# Patient Record
Sex: Female | Born: 1990 | Race: Black or African American | Hispanic: No | Marital: Single | Smoking: Never smoker
Health system: Southern US, Community
[De-identification: ages and names within clinical notes are randomized; demographics above are authoritative.]

## PROBLEM LIST (undated history)

## (undated) ENCOUNTER — Inpatient Hospital Stay (HOSPITAL_COMMUNITY): Payer: Self-pay

## (undated) DIAGNOSIS — J45909 Unspecified asthma, uncomplicated: Secondary | ICD-10-CM

## (undated) HISTORY — PX: WISDOM TOOTH EXTRACTION: SHX21

---

## 2015-03-22 ENCOUNTER — Ambulatory Visit (INDEPENDENT_AMBULATORY_CARE_PROVIDER_SITE_OTHER): Payer: Self-pay | Admitting: *Deleted

## 2015-03-22 ENCOUNTER — Encounter: Payer: Self-pay | Admitting: Obstetrics and Gynecology

## 2015-03-22 ENCOUNTER — Encounter: Payer: Self-pay | Admitting: *Deleted

## 2015-03-22 DIAGNOSIS — Z3201 Encounter for pregnancy test, result positive: Secondary | ICD-10-CM

## 2015-03-22 DIAGNOSIS — Z3490 Encounter for supervision of normal pregnancy, unspecified, unspecified trimester: Secondary | ICD-10-CM

## 2015-03-22 DIAGNOSIS — O219 Vomiting of pregnancy, unspecified: Secondary | ICD-10-CM

## 2015-03-22 LAB — POCT PREGNANCY, URINE: PREG TEST UR: POSITIVE — AB

## 2015-03-22 MED ORDER — PROMETHAZINE HCL 25 MG PO TABS: 25.0000 mg | ORAL_TABLET | Freq: Four times a day (QID) | ORAL | Status: DC | PRN

## 2015-03-22 NOTE — Progress Notes (Signed)
Here for pregnancy test, which was positive. States just moved here, would like to start prenatal care here. Given letter. Would like to wait on blood work .  States periods are regular, last was 02/09/15,. States has had some light spotting once or twice, no bleeding . Instructed her to come to [facility] if bleeding. C/o nausea , request medicine. Received order for phergan from Dr. Jolayne Pantheronstant.

## 2015-03-23 MED ORDER — PROMETHAZINE HCL 25 MG PO TABS: 25.0000 mg | ORAL_TABLET | Freq: Four times a day (QID) | ORAL | Status: DC | PRN

## 2015-03-23 NOTE — Addendum Note (Signed)
Addended by: Faythe CasaBELLAMY, JEANETTA M on: 03/23/2015 09:57 AM   Modules accepted: Orders

## 2015-05-04 ENCOUNTER — Encounter: Payer: Self-pay | Admitting: Family

## 2015-07-22 ENCOUNTER — Encounter: Payer: Self-pay | Admitting: Family Medicine

## 2015-07-22 ENCOUNTER — Ambulatory Visit (INDEPENDENT_AMBULATORY_CARE_PROVIDER_SITE_OTHER): Payer: Self-pay | Admitting: General Practice

## 2015-07-22 DIAGNOSIS — Z3201 Encounter for pregnancy test, result positive: Secondary | ICD-10-CM

## 2015-07-22 DIAGNOSIS — O219 Vomiting of pregnancy, unspecified: Secondary | ICD-10-CM

## 2015-07-22 LAB — POCT PREGNANCY, URINE: Preg Test, Ur: POSITIVE — AB

## 2015-07-22 MED ORDER — PROMETHAZINE HCL 25 MG PO TABS: 25.0000 mg | ORAL_TABLET | Freq: Four times a day (QID) | ORAL | Status: DC | PRN

## 2015-07-22 NOTE — Progress Notes (Signed)
Patient here today for upt. upt +. Patient states this is a different pregnancy from her positive test in December. First positive home test 07/16/15. LMP 06/18/15. EDD 03/24/16. [id number] today. Patient requests to start care here. Patient informed to make appt in 6-7 weeks and new OB packet given.

## 2015-07-26 ENCOUNTER — Telehealth: Payer: Self-pay | Admitting: General Practice

## 2015-07-26 NOTE — Telephone Encounter (Signed)
Patient called and left message stating she blacked out yesterday for a couple minutes and woke up shaky. Patient states her appt isn't until 09/07/15. Called patient and asked what her daily intake was like and if she has diabetes. Patient states she usually gets up in the morning and eats breakfast then doesn't eat again until later in the evening for dinner. Patient denies diabetes & states it happened while she was standing but her boyfriend caught her. Discussed importance of proper hydration with patient and importance of three meals a day with light snacks in between. Told patient it sounds like maybe it was related to that but to call back if this is reoccurring. Patient verbalized understanding to all & had no questions

## 2015-07-27 ENCOUNTER — Telehealth: Payer: Self-pay | Admitting: *Deleted

## 2015-07-27 NOTE — Telephone Encounter (Signed)
Kari Sandoval called and left a message this am stating she called yesterday about passing out and woke up shaking. States but then later her boyfriend told her he was with her and he thinks she had a seizure. Also c/o cramping and is concerned about seizure.   Called [name] and we discussed she has never had a seizure before. States she has not passed out again or had seizure activity. We discussed if that happens again to have someone bring her to [facility]U immediately for evaluation.   We also discussed her complaints of cramping- she denies severe pain or bleeding. We discussed a little cramping can be normal , but if she has bleeding or severe pain to come to mau for evalation. If she does not have any bleeding, severe pain or seizure activity come to appointment as scheduled and can discuss these complaints again with provider. She voices understanding.

## 2015-08-03 ENCOUNTER — Emergency Department (HOSPITAL_COMMUNITY)
Admission: EM | Admit: 2015-08-03 | Discharge: 2015-08-03 | Disposition: A | Discharge status: Home / Self Care | Payer: Medicaid Other | Attending: Emergency Medicine | Admitting: Emergency Medicine

## 2015-08-03 ENCOUNTER — Emergency Department (HOSPITAL_COMMUNITY): Payer: Medicaid Other

## 2015-08-03 ENCOUNTER — Encounter (HOSPITAL_COMMUNITY): Payer: Self-pay | Admitting: Family Medicine

## 2015-08-03 DIAGNOSIS — S3992XA Unspecified injury of lower back, initial encounter: Secondary | ICD-10-CM | POA: Insufficient documentation

## 2015-08-03 DIAGNOSIS — O4691 Antepartum hemorrhage, unspecified, first trimester: Secondary | ICD-10-CM | POA: Insufficient documentation

## 2015-08-03 DIAGNOSIS — Z3A01 Less than 8 weeks gestation of pregnancy: Secondary | ICD-10-CM | POA: Insufficient documentation

## 2015-08-03 DIAGNOSIS — O99512 Diseases of the respiratory system complicating pregnancy, second trimester: Secondary | ICD-10-CM | POA: Diagnosis not present

## 2015-08-03 DIAGNOSIS — O418X10 Other specified disorders of amniotic fluid and membranes, first trimester, not applicable or unspecified: Secondary | ICD-10-CM

## 2015-08-03 DIAGNOSIS — Y9389 Activity, other specified: Secondary | ICD-10-CM | POA: Diagnosis not present

## 2015-08-03 DIAGNOSIS — J45909 Unspecified asthma, uncomplicated: Secondary | ICD-10-CM | POA: Insufficient documentation

## 2015-08-03 DIAGNOSIS — S4991XA Unspecified injury of right shoulder and upper arm, initial encounter: Secondary | ICD-10-CM | POA: Insufficient documentation

## 2015-08-03 DIAGNOSIS — Y998 Other external cause status: Secondary | ICD-10-CM | POA: Diagnosis not present

## 2015-08-03 DIAGNOSIS — S3991XA Unspecified injury of abdomen, initial encounter: Secondary | ICD-10-CM | POA: Insufficient documentation

## 2015-08-03 DIAGNOSIS — O9A211 Injury, poisoning and certain other consequences of external causes complicating pregnancy, first trimester: Secondary | ICD-10-CM | POA: Diagnosis present

## 2015-08-03 DIAGNOSIS — O468X1 Other antepartum hemorrhage, first trimester: Secondary | ICD-10-CM

## 2015-08-03 DIAGNOSIS — Z79899 Other long term (current) drug therapy: Secondary | ICD-10-CM | POA: Diagnosis not present

## 2015-08-03 DIAGNOSIS — Y92410 Unspecified street and highway as the place of occurrence of the external cause: Secondary | ICD-10-CM | POA: Insufficient documentation

## 2015-08-03 DIAGNOSIS — R102 Other specified pregnancy related conditions, unspecified trimester: Secondary | ICD-10-CM

## 2015-08-03 HISTORY — DX: Unspecified asthma, uncomplicated: J45.909

## 2015-08-03 LAB — URINALYSIS, ROUTINE W REFLEX MICROSCOPIC
Bilirubin Urine: NEGATIVE
Glucose, UA: NEGATIVE mg/dL
HGB URINE DIPSTICK: NEGATIVE
Ketones, ur: NEGATIVE mg/dL
Nitrite: NEGATIVE
PROTEIN: NEGATIVE mg/dL
SPECIFIC GRAVITY, URINE: 1.005 (ref 1.005–1.030)
pH: 7 (ref 5.0–8.0)

## 2015-08-03 LAB — PREGNANCY, URINE: PREG TEST UR: POSITIVE — AB

## 2015-08-03 LAB — URINE MICROSCOPIC-ADD ON: RBC / HPF: NONE SEEN RBC/hpf (ref 0–5)

## 2015-08-03 MED ORDER — ACETAMINOPHEN 500 MG PO TABS
1000.0000 mg | ORAL_TABLET | Freq: Once | ORAL | Status: AC
  Administered 2015-08-03: 1000 mg via ORAL
  Filled 2015-08-03: qty 2

## 2015-08-03 MED ORDER — ACETAMINOPHEN 500 MG PO TABS: 500.0000 mg | ORAL_TABLET | Freq: Four times a day (QID) | ORAL | Status: DC | PRN
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
## 2015-08-04 LAB — URINE CULTURE

## 2015-08-27 ENCOUNTER — Telehealth: Payer: Self-pay | Admitting: Family Medicine

## 2015-08-27 NOTE — Telephone Encounter (Signed)
Patient called to cancel her New OB appointment. When I asked why she was canceling, she stated she had a miscarriage on 08/25/2015. I asked her when did they say she needed to come in for a F/U, she stated she had not gone to see anyone. I asked her if she would, [facility] so they could exam her.  She stated she was at work, and would come later.

## 2015-09-07 ENCOUNTER — Encounter: Payer: Self-pay | Admitting: Family

## 2015-11-23 ENCOUNTER — Encounter: Payer: Self-pay | Admitting: Nurse Practitioner

## 2015-12-28 DIAGNOSIS — H5711 Ocular pain, right eye: Secondary | ICD-10-CM | POA: Diagnosis not present

## 2015-12-28 DIAGNOSIS — H538 Other visual disturbances: Secondary | ICD-10-CM | POA: Diagnosis not present

## 2015-12-28 DIAGNOSIS — Z8669 Personal history of other diseases of the nervous system and sense organs: Secondary | ICD-10-CM | POA: Diagnosis not present

## 2015-12-28 DIAGNOSIS — H04129 Dry eye syndrome of unspecified lacrimal gland: Secondary | ICD-10-CM | POA: Diagnosis not present

## 2016-04-25 ENCOUNTER — Encounter: Payer: Self-pay | Admitting: Obstetrics and Gynecology

## 2016-05-11 DIAGNOSIS — Z3040 Encounter for surveillance of contraceptives, unspecified: Secondary | ICD-10-CM | POA: Diagnosis not present

## 2016-05-11 DIAGNOSIS — Z113 Encounter for screening for infections with a predominantly sexual mode of transmission: Secondary | ICD-10-CM | POA: Diagnosis not present

## 2016-05-11 DIAGNOSIS — Z6829 Body mass index (BMI) 29.0-29.9, adult: Secondary | ICD-10-CM | POA: Diagnosis not present

## 2016-05-11 DIAGNOSIS — Z124 Encounter for screening for malignant neoplasm of cervix: Secondary | ICD-10-CM | POA: Diagnosis not present

## 2016-05-11 DIAGNOSIS — R102 Pelvic and perineal pain: Secondary | ICD-10-CM | POA: Diagnosis not present

## 2016-05-11 DIAGNOSIS — Z01419 Encounter for gynecological examination (general) (routine) without abnormal findings: Secondary | ICD-10-CM | POA: Diagnosis not present

## 2016-07-13 ENCOUNTER — Ambulatory Visit (INDEPENDENT_AMBULATORY_CARE_PROVIDER_SITE_OTHER): Payer: 59 | Admitting: Internal Medicine

## 2016-07-13 VITALS — BP 104/70 | HR 80 | Temp 97.6°F | Wt 184.9 lb

## 2016-07-13 DIAGNOSIS — Z01818 Encounter for other preprocedural examination: Secondary | ICD-10-CM | POA: Insufficient documentation

## 2016-07-13 NOTE — Progress Notes (Signed)
26 y.o. year old female presents to establish care.  Moved 1.5 years ago from [place]. Was seen at [facility] two months ago for birth control prescription but otherwise has not established care at offices here in [place].   Acute Concerns: Patient wanting clearance for surgery. She intends to have a [place] butt lift and full body liposculpture performed next month by a physician in [place]. The surgery center is requesting certain labwork and imaging to be performed before patient can be cleared for procedures.   Diet: Reports diet is not very healthy. 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 Has tried to cut back so is only eating fast food about once a week now. Started cutting back on sodas one month ago so now mostly drinks water.   Exercise: No exercise.   Sexual/Birth History: Yes with boyfriend but he lives in [place]. [id number]. Recent SAB in 05/17.   Birth Control: Environmental education officer. Has been prescribed by [facility]. Using for past two months. Has whole year supply of refills.   Social:  Social History   Social History  . Marital status: Single    Spouse name: N/A  . Number of children: N/A  . Years of education: N/A   Social History Main Topics  . Smoking status: Never Smoker  . Smokeless tobacco: Not on file  . Alcohol use Yes     Comment: wine occ - "not since I found out I was pregnant"  . Drug use: No  . Sexual activity: Not on file   Other Topics Concern  . Not on file   Social History Narrative  . No narrative on file   Drinks alcohol at most once every two weeks. Smoked MJ in the past but not anymore.   No past surgical history on file. No Known Allergies  Immunization:  There is no immunization history on file for this patient.  Cancer Screening:  Pap Smear: Two months ago at [facility]. Was told no abnormalities.   Mammogram: N/A  Colonoscopy: N/A  Dexa:  N/A  Physical Exam: VITALS: Reviewed GEN: Pleasant female, NAD HEENT: Normocephalic, PERRL, EOMI, no scleral icterus, bilateral TM pearly grey, nasal septum midline, MMM, uvula midline, no anterior or posterior lymphadenopathy, no thyromegaly CARDIAC:RRR, S1 and S2 present, no murmur, no heaves/thrills RESP: CTAB, normal effort ABD: soft, no tenderness, normal bowel sounds EXT: No edema SKIN: no rash  ASSESSMENT & PLAN: 26 y.o. female presents for annual well woman/preventative exam and GYN exam. Please see problem specific assessment and plan.   Pre-op evaluation In preparation for elective cosmetic surgery (full body liposculpture and Brazilian butt lift) to be performed by physician in [place] next month. Patient unable to have labs and imaging performed today, as it is over a month from her procedure and labs and imaging must be performed within one month. Will return for labs.  - CBC, CMP, HIV, PT/INR, PTT, hCG to be drawn at next visit - CXR ordered to be performed at [facility] - EKG at next visit   Tarri Abernethy, MD, MPH PGY-2 [facility] Pager [phone]

## 2016-07-13 NOTE — Assessment & Plan Note (Signed)
In preparation for elective cosmetic surgery (full body liposculpture and Brazilian butt lift) to be performed by physician in [place] next month. Patient unable to have labs and imaging performed today, as it is over a month from her procedure and labs and imaging must be performed within one month. Will return for labs.  - CBC, CMP, HIV, PT/INR, PTT, hCG to be drawn at next visit - CXR ordered to be performed at [facility] - EKG at next visit

## 2016-07-13 NOTE — Patient Instructions (Signed)
It was nice meeting you today Ms. Kari Sandoval!  Please schedule another appointment to have your labs drawn and your EKG performed. You can have your CXR done at [facility] whenever it is convenient for you.   If you have any questions or concerns, please feel free to call the clinic.   Be well,  Dr. Natale Milch

## 2016-07-18 ENCOUNTER — Other Ambulatory Visit: Payer: 59

## 2016-07-21 ENCOUNTER — Other Ambulatory Visit: Payer: 59

## 2016-07-21 ENCOUNTER — Ambulatory Visit: Payer: 59 | Admitting: Internal Medicine

## 2016-08-02 ENCOUNTER — Other Ambulatory Visit: Payer: 59

## 2016-08-02 DIAGNOSIS — Z01818 Encounter for other preprocedural examination: Secondary | ICD-10-CM | POA: Diagnosis not present

## 2016-08-03 LAB — COMPREHENSIVE METABOLIC PANEL
ALBUMIN: 4.1 g/dL (ref 3.5–5.5)
ALT: 15 IU/L (ref 0–32)
AST: 14 IU/L (ref 0–40)
Albumin/Globulin Ratio: 1.4 (ref 1.2–2.2)
Alkaline Phosphatase: 67 IU/L (ref 39–117)
BUN/Creatinine Ratio: 10 (ref 9–23)
BUN: 7 mg/dL (ref 6–20)
Bilirubin Total: 0.3 mg/dL (ref 0.0–1.2)
CHLORIDE: 102 mmol/L (ref 96–106)
CO2: 22 mmol/L (ref 18–29)
CREATININE: 0.72 mg/dL (ref 0.57–1.00)
Calcium: 9.2 mg/dL (ref 8.7–10.2)
GFR calc non Af Amer: 115 mL/min/{1.73_m2} (ref >59)
GFR, EST AFRICAN AMERICAN: 133 mL/min/{1.73_m2} (ref >59)
GLOBULIN, TOTAL: 3 g/dL (ref 1.5–4.5)
Glucose: 107 mg/dL — ABNORMAL HIGH (ref 65–99)
POTASSIUM: 4.1 mmol/L (ref 3.5–5.2)
Sodium: 142 mmol/L (ref 134–144)
TOTAL PROTEIN: 7.1 g/dL (ref 6.0–8.5)

## 2016-08-03 LAB — CBC
HEMATOCRIT: 37.2 % (ref 34.0–46.6)
HEMOGLOBIN: 12.5 g/dL (ref 11.1–15.9)
MCH: 29.3 pg (ref 26.6–33.0)
MCHC: 33.6 g/dL (ref 31.5–35.7)
MCV: 87 fL (ref 79–97)
Platelets: 248 10*3/uL (ref 150–379)
RBC: 4.26 x10E6/uL (ref 3.77–5.28)
RDW: 12.9 % (ref 12.3–15.4)
WBC: 5.9 10*3/uL (ref 3.4–10.8)

## 2016-08-03 LAB — BETA HCG QUANT (REF LAB)

## 2016-08-03 LAB — APTT: aPTT: 25 s (ref 24–33)

## 2016-08-03 LAB — HIV ANTIBODY (ROUTINE TESTING W REFLEX): HIV Screen 4th Generation wRfx: NONREACTIVE

## 2016-08-03 LAB — PROTIME-INR
INR: 1 (ref 0.8–1.2)
PROTHROMBIN TIME: 10.4 s (ref 9.1–12.0)

## 2016-08-07 ENCOUNTER — Ambulatory Visit: Payer: 59 | Admitting: Internal Medicine

## 2016-08-08 ENCOUNTER — Telehealth: Payer: Self-pay | Admitting: Internal Medicine

## 2016-08-08 NOTE — Telephone Encounter (Signed)
Would like results.  She doesn't have access to [employer]. Could it be emailed to   [email or web address]?

## 2016-08-10 ENCOUNTER — Encounter: Payer: Self-pay | Admitting: Internal Medicine

## 2016-08-10 NOTE — Telephone Encounter (Signed)
Please call patient to let her know that we cannot email results to an unsecure email address. I have printed her results, and can either mail them to her or she can come pick them up. I have left them at the front desk for her to pick up, but if she would like them mailed, please find out which address to mail them to and I will do so. Thanks! - AJL

## 2016-08-11 NOTE — Telephone Encounter (Signed)
Pt has postponed her lipo surgery until dec.  She will be back in Nov to recheck her lab results. Her hemoglobin has to be above 13.  How can she increase that?

## 2016-08-14 NOTE — Telephone Encounter (Signed)
Called patient to discuss Hgb. Patient said she has switched surgeons and now Hgb of 12.5 is not an issue. She has rescheduled surgery to December and [facility].   Kari AbernethyAbigail J Lancaster, MD, MPH PGY-2 [facility] Pager [phone]

## 2016-09-18 ENCOUNTER — Telehealth: Payer: Self-pay | Admitting: Internal Medicine

## 2016-09-18 MED ORDER — LEVONORGESTREL 0.75 MG PO TABS: 0.7500 mg | ORAL_TABLET | Freq: Two times a day (BID) | ORAL | 0 refills | Status: DC

## 2016-09-18 NOTE — Telephone Encounter (Signed)
Pt would like emergency contraceptive or Plan B called into her pharmacy.  [facility] on [address].  Please let pt know if this can be done

## 2016-09-18 NOTE — Telephone Encounter (Signed)
Pt has called again for an answer about Plan B.  The encounter occurred Saturday night

## 2016-09-19 NOTE — Telephone Encounter (Signed)
Pt informed. Kari Sandoval, CMA  

## 2016-10-09 ENCOUNTER — Ambulatory Visit: Payer: 59 | Admitting: Family Medicine

## 2016-10-09 ENCOUNTER — Ambulatory Visit: Payer: 59 | Admitting: Obstetrics and Gynecology

## 2017-04-10 NOTE — L&D Delivery Note (Addendum)
OB/GYN Faculty Practice Delivery Note  Kari Sandoval is a 27 y.o. [id number] s/p SVD at [redacted]w[redacted]d. She was admitted for SOL.   ROM: 0h 16m with clear fluid GBS Status: negative Maximum Maternal Temperature: 98.3 F  Labor Progress: . AROM at 1043 . Epidural placed prior to delivery  Delivery Date/Time:1110 Delivery: Called to room and patient was complete and pushing. Head delivered LOA. Cord wrapped around anterior neck and under posterior axilla, reduced during delivery via summersault maneuver. Shoulder and body delivered in usual fashion. Infant with spontaneous cry, placed on mother's abdomen, dried and stimulated. Cord clamped x 2 after 1-minute delay, and cut by grandmother. Cord blood drawn. Placenta delivered spontaneously with gentle cord traction. Fundus firm with massage and Pitocin. Labia, perineum, vagina, and cervix inspected with small abrasion of the R vaginal wall noted, hemostatic and no repair necessitated.   Placenta: intact Complications: none Lacerations: none EBL:  Infant: vigorous female  APGARs 9 and 10  not yet weighed  Burman Nieves, MD Family Medicine Resident    I was gloved and present for entire period of pushing as well as delivery SVD without incident No difficulty with shoulders No lacerations Abrasion as listed above  Patient to postpartum, infant to remain with mother.   Clayton Bibles, CNM 02/10/18  12:37 PM

## 2017-04-16 ENCOUNTER — Encounter: Payer: Self-pay | Admitting: Internal Medicine

## 2017-04-16 ENCOUNTER — Ambulatory Visit (INDEPENDENT_AMBULATORY_CARE_PROVIDER_SITE_OTHER): Payer: Self-pay | Admitting: *Deleted

## 2017-04-16 ENCOUNTER — Ambulatory Visit (INDEPENDENT_AMBULATORY_CARE_PROVIDER_SITE_OTHER): Payer: Medicaid Other | Admitting: Internal Medicine

## 2017-04-16 ENCOUNTER — Other Ambulatory Visit: Payer: Self-pay

## 2017-04-16 DIAGNOSIS — N920 Excessive and frequent menstruation with regular cycle: Secondary | ICD-10-CM | POA: Insufficient documentation

## 2017-04-16 DIAGNOSIS — Z111 Encounter for screening for respiratory tuberculosis: Secondary | ICD-10-CM

## 2017-04-16 DIAGNOSIS — Z309 Encounter for contraceptive management, unspecified: Secondary | ICD-10-CM | POA: Diagnosis not present

## 2017-04-16 DIAGNOSIS — Z7689 Persons encountering health services in other specified circumstances: Secondary | ICD-10-CM

## 2017-04-16 NOTE — Progress Notes (Signed)
   Patient presents for PPD placement for work Denies previous positive TB test  Denies known exposure to TB   Tuberculin skin test applied to left ventral forearm.  Patient aware that she needs to return in 48-72 hours for PPD reading. L. Ducatte, RN, BSN    

## 2017-04-16 NOTE — Assessment & Plan Note (Signed)
Interfering with normal daily activities. Regular monthly period. Taking ibuprofen already, though not on a regular schedule.  - Begin ibuprofen about 3 days prior to onset of period, even if not experiencing symptoms. 200-400mg  q6-8 regularly for duration of period.  - Also discussed importance of hydration, especially during period

## 2017-04-16 NOTE — Patient Instructions (Addendum)
It was nice seeing you again today Kari Sandoval!  For your period cramps, begin taking 400 mg ibuprofen (two tablets) 3 days before your period is scheduled to start. Do not wait until you begin to feel symptoms of your period coming on. Take 400 mg ibuprofen every 6-8 hours regularly until the end of your period. It is also important to drink plenty of water to help keep your muscles from becoming dehydrated and worsening your cramps.   Please schedule an appointment at your earliest convenience to have a pap smear done to screen for cervical cancer. It is recommended to have this done every 3 years starting at age 29, and you have never had one.   We set two goals today: 1. Begin going to the gym one hour three times a week. Goal is 150 minutes of aerobic exercise weekly.  2. Decrease the amount of juice you are drinking to 4 ounces or less per day. You should primarily be drinking water (about eight 12 ounce glasses daily).   If you have any questions or concerns, please feel free to call the clinic.   Be well,  Dr. Natale MilchLancaster

## 2017-04-16 NOTE — Progress Notes (Signed)
27 y.o. year old female presents for well woman/preventative visit.  Acute Concerns: Menorrhagia Patient reporting significant pain 2/2 menstrual cramps monthly. Pain is so severe at times that she is unable to go about her normal daily activities. Takes Motrin which helps some. Periods usually last 4-6 days and come monthly. On heaviest day uses three "heavy" pads. Is not on any form of hormonal contraception.   Diet: Not well-balanced, but she is currently trying to change her diet. Typically only eats breakfast and dinner, but is trying to eat lunch more frequently. Also trying to cut down on amount of fast food she is eating. Drinks mostly juice daily, as well as hot chocolate. Rarely drinks water, though recently bought Crystal Light lemonade powder in an attempt to drink more water.  Of note, patient is planning to have liposuction performed in about two months.   Exercise: None currently. Plans to start a membership at [employer] in two weeks. Would like to begin exercising for 1 hour 7 days per week.    Sexual/Birth History: G2P0020. Currently sexually active.    Birth Control: Condoms  Surgical History: No past surgical history on file.  Allergies: No Known Allergies  Social:  Social History   Socioeconomic History  . Marital status: Single    Spouse name: Not on file  . Number of children: Not on file  . Years of education: Not on file  . Highest education level: Not on file  Social Needs  . Financial resource strain: Not on file  . Food insecurity - worry: Not on file  . Food insecurity - inability: Not on file  . Transportation needs - medical: Not on file  . Transportation needs - non-medical: Not on file  Occupational History  . Not on file  Tobacco Use  . Smoking status: Never Smoker  Substance and Sexual Activity  . Alcohol use: Yes    Comment: wine occ - "not since I found out I was pregnant"  . Drug use: No  . Sexual activity: Not on file  Other  Topics Concern  . Not on file  Social History Narrative  . Not on file   Drinks EtOH 1-2x/wk.   Immunization: Immunization History  Administered Date(s) Administered  . PPD Test 04/16/2017    Cancer Screening:  Pap Smear: None on file. Declining today.   Mammogram: N/A  Colonoscopy: N/A  Physical Exam: VITALS: Reviewed GEN: Pleasant female, NAD HEENT: Normocephalic, PERRL, EOMI, no scleral icterus, MMM, uvula midline CARDIAC:RRR, S1 and S2 present, no murmur, no heaves/thrills RESP: CTAB, normal effort ABD: Soft, no tenderness, normal bowel sounds SKIN: Warm and dry, no rash MSK: 5/5 strength upper and lower extremities bilaterally NEURO: A&Ox3. CN II-XII grossly intact.  PSYCH: Appropriate mood and affect  ASSESSMENT & PLAN: 27 y.o. female presents for annual well woman/preventative exam and GYN exam. Please see problem specific assessment and plan.   Encounter for weight management Discussed setting realistic goals. Also discussed recommended amount of aerobic activity weekly.   Goals set today: 1. Begin going to the gym one hour three times a week. Goal is 150 minutes of aerobic exercise weekly.  2. Decrease the amount of juice you are drinking to 4 ounces or less per day. You should primarily be drinking water (about eight 12 ounce glasses daily).   F/u at next visit.   Menorrhagia Interfering with normal daily activities. Regular monthly period. Taking ibuprofen already, though not on a regular schedule.  - Begin ibuprofen  about 3 days prior to onset of period, even if not experiencing symptoms. 200-400mg  q6-8 regularly for duration of period.  - Also discussed importance of hydration, especially during period   Tarri AbernethyAbigail J Jeanenne Licea, MD, MPH PGY-3 [facility] Pager [phone]

## 2017-04-16 NOTE — Assessment & Plan Note (Signed)
Discussed setting realistic goals. Also discussed recommended amount of aerobic activity weekly.   Goals set today: 1. Begin going to the gym one hour three times a week. Goal is 150 minutes of aerobic exercise weekly.  2. Decrease the amount of juice you are drinking to 4 ounces or less per day. You should primarily be drinking water (about eight 12 ounce glasses daily).   F/u at next visit.

## 2017-04-18 ENCOUNTER — Ambulatory Visit (INDEPENDENT_AMBULATORY_CARE_PROVIDER_SITE_OTHER): Payer: Self-pay | Admitting: *Deleted

## 2017-04-18 DIAGNOSIS — Z111 Encounter for screening for respiratory tuberculosis: Secondary | ICD-10-CM

## 2017-04-18 LAB — TB SKIN TEST
Induration: 2 mm
TB Skin Test: NEGATIVE

## 2017-04-18 NOTE — Progress Notes (Signed)
   PPD Reading Note PPD read and results entered in Epic Result: 2 mm induration. Interpretation: Negative  Given letter with results L. Leward Quanucatte, RN, BSN

## 2017-04-27 ENCOUNTER — Ambulatory Visit: Payer: Medicaid Other

## 2017-05-01 ENCOUNTER — Ambulatory Visit: Payer: Self-pay | Admitting: *Deleted

## 2017-05-01 ENCOUNTER — Ambulatory Visit: Payer: Self-pay

## 2017-05-01 DIAGNOSIS — Z23 Encounter for immunization: Secondary | ICD-10-CM

## 2017-05-01 NOTE — Progress Notes (Signed)
   Patient presents for MMR vaccine for work. Tolerated injection well. VIS statement given. Hubbard Hartshorn, RN, BSN

## 2017-06-15 ENCOUNTER — Encounter: Payer: Self-pay | Admitting: Internal Medicine

## 2017-06-15 ENCOUNTER — Other Ambulatory Visit: Payer: Self-pay

## 2017-06-15 ENCOUNTER — Other Ambulatory Visit: Payer: Medicaid Other

## 2017-06-15 ENCOUNTER — Ambulatory Visit (INDEPENDENT_AMBULATORY_CARE_PROVIDER_SITE_OTHER): Payer: Medicaid Other | Admitting: Internal Medicine

## 2017-06-15 VITALS — BP 108/68 | HR 78 | Temp 98.3°F | Ht 66.5 in | Wt 182.2 lb

## 2017-06-15 DIAGNOSIS — Z01818 Encounter for other preprocedural examination: Secondary | ICD-10-CM

## 2017-06-15 DIAGNOSIS — Z3201 Encounter for pregnancy test, result positive: Secondary | ICD-10-CM

## 2017-06-15 LAB — POCT UA - MICROSCOPIC ONLY

## 2017-06-15 LAB — POCT URINE PREGNANCY: PREG TEST UR: POSITIVE — AB

## 2017-06-15 LAB — POCT URINALYSIS DIP (MANUAL ENTRY)
BILIRUBIN UA: NEGATIVE
Blood, UA: NEGATIVE
Glucose, UA: NEGATIVE mg/dL
Ketones, POC UA: NEGATIVE mg/dL
NITRITE UA: NEGATIVE
PH UA: 6.5 (ref 5.0–8.0)
PROTEIN UA: NEGATIVE mg/dL
Spec Grav, UA: 1.025 (ref 1.010–1.025)
Urobilinogen, UA: 0.2 E.U./dL

## 2017-06-15 NOTE — Progress Notes (Signed)
   Subjective:   Patient: Kari Sandoval       Birthdate: March 02, 1991       MRN: [id number]      HPI  Kari PoliteLatoya Gilkison is a 27 y.o. female presenting for preop clearance.   Preop clearance Patient wishing to have a [facility] and is needing preop labs and imaging today.  Procedure is to be performed by Dr. Haywood [facility]oHasan at [facility] in [place], [place]. Presented for this same issue in April and had labs drawn at that time, however patient did not go through with the procedure then, and needs repeat labs. Procedure is to take place on 07/02/17 in [place]. EKG no longer required, only CXR and labs. Of note, patient wanting pregnancy test today, as she knows she had unprotected sex while she was ovulating. Has not missed period yet, but says it is due within the next two days.   Smoking status reviewed. Patient is never smoker.   Review of Systems See HPI.     Objective:  Physical Exam  Constitutional: She is oriented to person, place, and time and well-developed, well-nourished, and in no distress.  HENT:  Head: Normocephalic and atraumatic.  Nose: Nose normal.  Mouth/Throat: Oropharynx is clear and moist. No oropharyngeal exudate.  Eyes: Conjunctivae and EOM are normal. Pupils are equal, round, and reactive to light. Right eye exhibits no discharge. Left eye exhibits no discharge.  Neck: Normal range of motion. Neck supple.  Cardiovascular: Normal rate, regular rhythm and normal heart sounds.  No murmur heard. Pulmonary/Chest: Effort normal and breath sounds normal. No respiratory distress. She has no wheezes.  Abdominal: Soft. Bowel sounds are normal. She exhibits no distension. There is no tenderness.  Musculoskeletal:  5/5 strength upper and lower extremities bilaterally  Lymphadenopathy:    She has no cervical adenopathy.  Neurological: She is alert and oriented to person, place, and time.  Skin: Skin is warm and dry.  Psychiatric:  Affect and judgment normal.     Assessment & Plan:  Pre-op evaluation Patient with faintly positive urine pregnancy, which is to be expected as patient has not yet missed period. Obtaining serum hCG for confirmation. If patient pregnant, will not medically clear for cosmetic procedure. Will call patient with results of serum hCG are available.    Tarri AbernethyAbigail J Lancaster, MD, MPH PGY-3 [facility] Pager [phone]

## 2017-06-15 NOTE — Assessment & Plan Note (Signed)
Patient with faintly positive urine pregnancy, which is to be expected as patient has not yet missed period. Obtaining serum hCG for confirmation. If patient pregnant, will not medically clear for cosmetic procedure. Will call patient with results of serum hCG are available.

## 2017-06-15 NOTE — Patient Instructions (Addendum)
It was nice seeing you again today Ms. Kari Sandoval!  We will let you know when your documentation is ready to be picked up, likely Monday morning.   If you have any questions or concerns, please feel free to call the clinic.   Be well,  Dr. Natale MilchLancaster

## 2017-06-16 LAB — CBC WITH DIFFERENTIAL
BASOS: 1 %
Basophils Absolute: 0 10*3/uL (ref 0.0–0.2)
EOS (ABSOLUTE): 0 10*3/uL (ref 0.0–0.4)
Eos: 1 %
HEMOGLOBIN: 13.2 g/dL (ref 11.1–15.9)
Hematocrit: 38.9 % (ref 34.0–46.6)
Immature Grans (Abs): 0 10*3/uL (ref 0.0–0.1)
Immature Granulocytes: 0 %
LYMPHS ABS: 1.6 10*3/uL (ref 0.7–3.1)
Lymphs: 37 %
MCH: 29.3 pg (ref 26.6–33.0)
MCHC: 33.9 g/dL (ref 31.5–35.7)
MCV: 86 fL (ref 79–97)
MONOCYTES: 6 %
Monocytes Absolute: 0.2 10*3/uL (ref 0.1–0.9)
NEUTROS ABS: 2.5 10*3/uL (ref 1.4–7.0)
Neutrophils: 55 %
RBC: 4.5 x10E6/uL (ref 3.77–5.28)
RDW: 13.5 % (ref 12.3–15.4)
WBC: 4.4 10*3/uL (ref 3.4–10.8)

## 2017-06-16 LAB — CMP14+EGFR
A/G RATIO: 1.5 (ref 1.2–2.2)
ALBUMIN: 4.4 g/dL (ref 3.5–5.5)
ALK PHOS: 67 IU/L (ref 39–117)
ALT: 14 IU/L (ref 0–32)
AST: 19 IU/L (ref 0–40)
BILIRUBIN TOTAL: 0.4 mg/dL (ref 0.0–1.2)
BUN / CREAT RATIO: 15 (ref 9–23)
BUN: 12 mg/dL (ref 6–20)
CHLORIDE: 103 mmol/L (ref 96–106)
CO2: 20 mmol/L (ref 20–29)
Calcium: 9.5 mg/dL (ref 8.7–10.2)
Creatinine, Ser: 0.79 mg/dL (ref 0.57–1.00)
GFR calc non Af Amer: 102 mL/min/{1.73_m2} (ref >59)
GFR, EST AFRICAN AMERICAN: 118 mL/min/{1.73_m2} (ref >59)
Globulin, Total: 2.9 g/dL (ref 1.5–4.5)
Glucose: 77 mg/dL (ref 65–99)
POTASSIUM: 4 mmol/L (ref 3.5–5.2)
SODIUM: 139 mmol/L (ref 134–144)
Total Protein: 7.3 g/dL (ref 6.0–8.5)

## 2017-06-16 LAB — PROTIME-INR
INR: 1 (ref 0.8–1.2)
Prothrombin Time: 10.6 s (ref 9.1–12.0)

## 2017-06-16 LAB — HCG, SERUM, QUALITATIVE: hCG,Beta Subunit,Qual,Serum: POSITIVE m[IU]/mL — AB (ref <6)

## 2017-06-16 LAB — HIV ANTIBODY (ROUTINE TESTING W REFLEX): HIV Screen 4th Generation wRfx: NONREACTIVE

## 2017-06-18 ENCOUNTER — Telehealth: Payer: Self-pay | Admitting: Internal Medicine

## 2017-06-18 NOTE — Telephone Encounter (Signed)
Patient with positive urine pregnancy at appt on 03/08. Serum hCG drawn for confirmation. Called to inform patient this was positive for pregnancy as well. No answer, so left message. Encouraged patient to call to schedule initial prenatal appt at her earliest convenience and provided clinic front desk phone number. Also informed patient that, as she is pregnant, I do not recommend proceeding with [place] butt [place] cosmetic procedure and will not be completing surgical clearance forms.   Tarri AbernethyAbigail J Vermell Madrid, MD, MPH PGY-3 [facility] Pager [phone]

## 2017-06-21 NOTE — Telephone Encounter (Signed)
Results and information given. Transferred to Annice PihJackie to schedule initial prenatal visit. Ples SpecterAlisa Eunice Oldaker, RN [facility] RN)

## 2017-06-25 ENCOUNTER — Telehealth: Payer: Self-pay | Admitting: Internal Medicine

## 2017-06-25 NOTE — Telephone Encounter (Signed)
Pt needs a letter stating that she is pregnant to take with her when she applies for Medicaid. Please call her when this letter had been left up front.

## 2017-06-25 NOTE — Telephone Encounter (Signed)
Patient was called and informed that verification letter has been left up front.Kari HawkSimpson, Kari Sandoval, CMA

## 2017-07-02 ENCOUNTER — Telehealth: Payer: Self-pay | Admitting: Internal Medicine

## 2017-07-02 NOTE — Telephone Encounter (Signed)
Patient presenting to clinic today requesting letter with estimated due date to provide to Medicaid. Patient has not been seen for prenatal appt yet, and was noted to be pregnant on routine urine pregnancy test for pre-op clearance at last visit on 03/05. Had not yet missed her period at that point. Difficult to estimate due date as patient unsure of first date of LMP. Estimating LMP at one month prior to last visit, estimated due date if Feb 19, 2018. Provided letter stating this, but also stating that that date is likely to change once ultrasound is performed. Patient has initial OB appt scheduled soon.   Tarri AbernethyAbigail J Dessa Ledee, MD, MPH PGY-3 [facility] Pager [phone]

## 2017-07-03 ENCOUNTER — Other Ambulatory Visit (INDEPENDENT_AMBULATORY_CARE_PROVIDER_SITE_OTHER): Payer: Self-pay

## 2017-07-03 ENCOUNTER — Telehealth: Payer: Self-pay | Admitting: Family Medicine

## 2017-07-03 DIAGNOSIS — Z3491 Encounter for supervision of normal pregnancy, unspecified, first trimester: Secondary | ICD-10-CM

## 2017-07-03 LAB — POCT URINALYSIS DIP (MANUAL ENTRY)
BILIRUBIN UA: NEGATIVE
BILIRUBIN UA: NEGATIVE mg/dL
GLUCOSE UA: NEGATIVE mg/dL
Nitrite, UA: NEGATIVE
PH UA: 6.5 (ref 5.0–8.0)
Protein Ur, POC: NEGATIVE mg/dL
RBC UA: NEGATIVE
Spec Grav, UA: 1.01 (ref 1.010–1.025)
Urobilinogen, UA: 1 E.U./dL

## 2017-07-03 LAB — POCT UA - MICROSCOPIC ONLY

## 2017-07-03 MED ORDER — DOXYLAMINE SUCCINATE (SLEEP) 25 MG PO TABS: 25.0000 mg | ORAL_TABLET | Freq: Every evening | ORAL | 0 refills | Status: DC | PRN

## 2017-07-03 NOTE — Telephone Encounter (Signed)
  Sent in rx for doxylamine 25 mg, she can take 1 tablet at night. If she needs more to control nausea she can then take 1/2 tab in morning, 1/2 in afternoon, and another tab at night.   Dolores PattyAngela Laycie Schriner, DO PGY-2, [facility] 07/03/2017 4:04 PM

## 2017-07-03 NOTE — Telephone Encounter (Signed)
Patient came to office for labs. Patient ask if something could be called in for nausea. [facility] at [address] If any questions call patient at [phone]

## 2017-07-03 NOTE — Telephone Encounter (Signed)
Will forward to Dr. Wonda Oldsiccio to see what patient can take now that she is pregnant for nausea.  She has her new OB visit on 07-11-17 with Dr. Natale MilchLancaster. Jazmin Hartsell,CMA

## 2017-07-05 LAB — PRENATAL PROFILE I(LABCORP)
Antibody Screen: NEGATIVE
Basophils Absolute: 0 10*3/uL (ref 0.0–0.2)
Basos: 0 %
EOS (ABSOLUTE): 0 10*3/uL (ref 0.0–0.4)
Eos: 1 %
Hematocrit: 37.7 % (ref 34.0–46.6)
Hemoglobin: 12.7 g/dL (ref 11.1–15.9)
Hepatitis B Surface Ag: NEGATIVE
Immature Grans (Abs): 0 10*3/uL (ref 0.0–0.1)
Immature Granulocytes: 0 %
Lymphocytes Absolute: 1.4 10*3/uL (ref 0.7–3.1)
Lymphs: 30 %
MCH: 29.7 pg (ref 26.6–33.0)
MCHC: 33.7 g/dL (ref 31.5–35.7)
MCV: 88 fL (ref 79–97)
Monocytes Absolute: 0.2 10*3/uL (ref 0.1–0.9)
Monocytes: 5 %
Neutrophils Absolute: 3 10*3/uL (ref 1.4–7.0)
Neutrophils: 64 %
Platelets: 254 10*3/uL (ref 150–379)
RBC: 4.28 x10E6/uL (ref 3.77–5.28)
RDW: 12.5 % (ref 12.3–15.4)
RPR Ser Ql: NONREACTIVE
Rh Factor: NEGATIVE
Rubella Antibodies, IGG: 22.8 {index}
WBC: 4.7 10*3/uL (ref 3.4–10.8)

## 2017-07-05 LAB — CULTURE, OB URINE

## 2017-07-05 LAB — URINE CULTURE, OB REFLEX

## 2017-07-05 LAB — SICKLE CELL SCREEN: Sickle Cell Screen: NEGATIVE

## 2017-07-11 ENCOUNTER — Other Ambulatory Visit (HOSPITAL_COMMUNITY)
Admission: RE | Admit: 2017-07-11 | Discharge: 2017-07-11 | Disposition: A | Discharge status: Home / Self Care | Payer: Medicaid Other | Source: Ambulatory Visit | Attending: Family Medicine | Admitting: Family Medicine

## 2017-07-11 ENCOUNTER — Other Ambulatory Visit: Payer: Self-pay

## 2017-07-11 ENCOUNTER — Encounter: Payer: Self-pay | Admitting: Internal Medicine

## 2017-07-11 ENCOUNTER — Ambulatory Visit (INDEPENDENT_AMBULATORY_CARE_PROVIDER_SITE_OTHER): Payer: Medicaid Other | Admitting: Internal Medicine

## 2017-07-11 VITALS — BP 102/60 | HR 86 | Temp 98.3°F | Wt 183.0 lb

## 2017-07-11 DIAGNOSIS — Z3481 Encounter for supervision of other normal pregnancy, first trimester: Secondary | ICD-10-CM

## 2017-07-11 DIAGNOSIS — Z3490 Encounter for supervision of normal pregnancy, unspecified, unspecified trimester: Secondary | ICD-10-CM | POA: Insufficient documentation

## 2017-07-11 NOTE — Progress Notes (Signed)
Kari PoliteLatoya Sandoval is a 27 y.o. yo [id number] at 2351w1d who presents for her initial prenatal visit. Pregnancy is not planned She reports nausea. Didn't pick up prescription for doxylamine. Says she didn't know it was called in. Picked up "pregnancy poppers" at [employer]. Helps some days. Has been eating smaller meals but still causes nausea and vomiting.  She  is not taking PNV. She bought some but has not been taking them due to nausea.  See flow sheet for details.  PMH, POBH, FH, meds, allergies and Social Hx reviewed. PMH significant only for asthma. Has albuterol inhaler which she typically uses 1-2x/month.  Currently living at home with her two kids.   Prenatal Exam: Gen: Well nourished, well developed.  No distress.  Vitals noted. HEENT: Normocephalic, atraumatic.  Neck supple without cervical lymphadenopathy, thyromegaly or thyroid nodules.  Fair dentition. CV: RRR no murmur, gallops or rubs Lungs: CTAB.  Normal respiratory effort without wheezes or rales. Abd: soft, NTND. +BS.  Uterus not appreciated above pelvis. GU: Normal external female genitalia without lesions.  Normal vaginal, well rugated without lesions. No vaginal discharge.  Bimanual exam: No adnexal mass or TTP. No CMT.  Uterus size consistent with early pregnancy.  Ext: No clubbing, cyanosis or edema. Psych: Normal grooming and dress.  Not depressed or anxious appearing.  Normal thought content and process without flight of ideas or looseness of associations.  Assessment & Plan: 1) 27 y.o. yo [id number] at 6351w1d via LMP doing well.  Current pregnancy issues include nausea. Discussed picking up previously prescribed doxylamine from pharmacy and taking as directed. Encouraged to continue small meals as she has been.  Dating is reliable. Prenatal labs reviewed, unremarkable. Genetic screening offered: Yes. Will get integrated screen at next appt.  Early glucola is not indicated.  PHQ-9 and Pregnancy Medical Home forms completed and  reviewed.  Bleeding and pain precautions reviewed. Importance of prenatal vitamins reviewed.  Follow up in 4 weeks.  Tarri AbernethyAbigail J Miho Monda, MD, MPH PGY-3 [facility] Pager [phone]

## 2017-07-11 NOTE — Patient Instructions (Addendum)
It was nice seeing you today Kari Sandoval!  It is very important to take your prenatal vitamins EVERY DAY. Prenatal vitamins are important throughout your whole pregnancy, but are most important at the beginning, so you should take them even if you are nauseated.   For nausea, pick up the doxylamine tablets at your pharmacy today. Take one tablet tonight. If you still have nausea tomorrow, you can take one half tablet in the morning. If you are still nauseated later in the day, you can take an additional half tablet in the afternoon.   Please go to [facility] if you notice any of the following: - You have abdominal pain - You have so much nausea and vomiting that you are unable to eat or drink - You have vaginal bleeding or discharge  The address for [facility] is: [address]  [place], [place] [address] [phone]   I will see you back in 4 weeks for your next prenatal appointment.   If you have any questions or concerns in the meantime, please feel free to call the clinic.   Be well,  Dr. Natale Milch   Prenatal Care WHAT IS PRENATAL CARE? Prenatal care is the process of caring for a pregnant woman before she gives birth. Prenatal care makes sure that she and her baby remain as healthy as possible throughout pregnancy. Prenatal care may be provided by a midwife, family practice health care provider, or a childbirth and pregnancy specialist (obstetrician). Prenatal care may include physical examinations, testing, treatments, and education on nutrition, lifestyle, and social support services. WHY IS PRENATAL CARE SO IMPORTANT? Early and consistent prenatal care increases the chance that you and your baby will remain healthy throughout your pregnancy. This type of care also decreases a baby's risk of being born too early (prematurely), or being born smaller than expected (small for gestational age). Any underlying medical conditions you may have that could pose a risk during your  pregnancy are discussed during prenatal care visits. You will also be monitored regularly for any new conditions that may arise during your pregnancy so they can be treated quickly and effectively. WHAT HAPPENS DURING PRENATAL CARE VISITS? Prenatal care visits may include the following: Discussion Tell your health care provider about any new signs or symptoms you have experienced since your last visit. These might include:  Nausea or vomiting.  Increased or decreased level of energy.  Difficulty sleeping.  Back or leg pain.  Weight changes.  Frequent urination.  Shortness of breath with physical activity.  Changes in your skin, such as the development of a rash or itchiness.  Vaginal discharge or bleeding.  Feelings of excitement or nervousness.  Changes in your baby's movements.  You may want to write down any questions or topics you want to discuss with your health care provider and bring them with you to your appointment. Examination During your first prenatal care visit, you will likely have a complete physical exam. Your health care provider will often examine your vagina, cervix, and the position of your uterus, as well as check your heart, lungs, and other body systems. As your pregnancy progresses, your health care provider will measure the size of your uterus and your baby's position inside your uterus. He or she may also examine you for early signs of labor. Your prenatal visits may also include checking your blood pressure and, after about 10-12 weeks of pregnancy, listening to your baby's heartbeat. Testing Regular testing often includes:  Urinalysis. This checks  your urine for glucose, protein, or signs of infection.  Blood count. This checks the levels of white and red blood cells in your body.  Tests for sexually transmitted infections (STIs). Testing for STIs at the beginning of pregnancy is routinely done and is required in many states.  Antibody testing. You  will be checked to see if you are immune to certain illnesses, such as rubella, that can affect a developing fetus.  Glucose screen. Around 24-28 weeks of pregnancy, your blood glucose level will be checked for signs of gestational diabetes. Follow-up tests may be recommended.  Group B strep. This is a bacteria that is commonly found inside a woman's vagina. This test will inform your health care provider if you need an antibiotic to reduce the amount of this bacteria in your body prior to labor and childbirth.  Ultrasound. Many pregnant women undergo an ultrasound screening around 18-20 weeks of pregnancy to evaluate the health of the fetus and check for any developmental abnormalities.  HIV (human immunodeficiency virus) testing. Early in your pregnancy, you will be screened for HIV. If you are at high risk for HIV, this test may be repeated during your third trimester of pregnancy.  You may be offered other testing based on your age, personal or family medical history, or other factors. HOW OFTEN SHOULD I PLAN TO SEE MY HEALTH CARE PROVIDER FOR PRENATAL CARE? Your prenatal care check-up schedule depends on any medical conditions you have before, or develop during, your pregnancy. If you do not have any underlying medical conditions, you will likely be seen for checkups:  Monthly, during the first 6 months of pregnancy.  Twice a month during months 7 and 8 of pregnancy.  Weekly starting in the 9th month of pregnancy and until delivery.  If you develop signs of early labor or other concerning signs or symptoms, you may need to see your health care provider more often. Ask your health care provider what prenatal care schedule is best for you. WHAT CAN I DO TO KEEP MYSELF AND MY BABY AS HEALTHY AS POSSIBLE DURING MY PREGNANCY?  Take a prenatal vitamin containing 400 micrograms (0.4 mg) of folic acid every day. Your health care provider may also ask you to take additional vitamins such as  iodine, vitamin D, iron, copper, and zinc.  Take 1500-2000 mg of calcium daily starting at your 20th week of pregnancy until you deliver your baby.  Make sure you are up to date on your vaccinations. Unless directed otherwise by your health care provider: ? You should receive a tetanus, diphtheria, and pertussis (Tdap) vaccination between the 27th and 36th week of your pregnancy, regardless of when your last Tdap immunization occurred. This helps protect your baby from whooping cough (pertussis) after he or she is born. ? You should receive an annual inactivated influenza vaccine (IIV) to help protect you and your baby from influenza. This can be done at any point during your pregnancy.  Eat a well-rounded diet that includes: ? Fresh fruits and vegetables. ? Lean proteins. ? Calcium-rich foods such as milk, yogurt, hard cheeses, and dark, leafy greens. ? Whole grain breads.  Do noteat seafood high in mercury, including: ? Swordfish. ? Tilefish. ? Shark. ? King mackerel. ? More than 6 oz tuna per week.  Do not eat: ? Raw or undercooked meats or eggs. ? Unpasteurized foods, such as soft cheeses (brie, blue, or feta), juices, and milks. ? Lunch meats. ? Hot dogs that have not been heated  until they are steaming.  Drink enough water to keep your urine clear or pale yellow. For many women, this may be 10 or more 8 oz glasses of water each day. Keeping yourself hydrated helps deliver nutrients to your baby and may prevent the start of pre-term uterine contractions.  Do not use any tobacco products including cigarettes, chewing tobacco, or electronic cigarettes. If you need help quitting, ask your health care provider.  Do not drink beverages containing alcohol. No safe level of alcohol consumption during pregnancy has been determined.  Do not use any illegal drugs. These can harm your developing baby or cause a miscarriage.  Ask your health care provider or pharmacist before taking any  prescription or over-the-counter medicines, herbs, or supplements.  Limit your caffeine intake to no more than 200 mg per day.  Exercise. Unless told otherwise by your health care provider, try to get 30 minutes of moderate exercise most days of the week. Do not  do high-impact activities, contact sports, or activities with a high risk of falling, such as horseback riding or downhill skiing.  Get plenty of rest.  Avoid anything that raises your body temperature, such as hot tubs and saunas.  If you own a cat, do not empty its litter box. Bacteria contained in cat feces can cause an infection called toxoplasmosis. This can result in serious harm to the fetus.  Stay away from chemicals such as insecticides, lead, mercury, and cleaning or paint products that contain solvents.  Do not have any X-rays taken unless medically necessary.  Take a childbirth and breastfeeding preparation class. Ask your health care provider if you need a referral or recommendation.  This information is not intended to replace advice given to you by your health care provider. Make sure you discuss any questions you have with your health care provider. Document Released: 03/30/2003 Document Revised: 08/30/2015 Document Reviewed: 06/11/2013 Elsevier Interactive Patient Education  2017 [employer].

## 2017-07-12 LAB — CERVICOVAGINAL ANCILLARY ONLY
Chlamydia: NEGATIVE
Neisseria Gonorrhea: NEGATIVE

## 2017-08-02 ENCOUNTER — Telehealth: Payer: Self-pay

## 2017-08-02 NOTE — Telephone Encounter (Signed)
Pt left message on nurse line, she is [redacted] weeks pregnant and having pain in her lower abdomen. Attempted to call back, no answer left voicemail to return call. Shawna OrleansMeredith B Hser Belanger, RN

## 2017-08-03 NOTE — Telephone Encounter (Signed)
Please instruct Kari Sandoval to go to MAU if she is experiencing pain. Thank you!

## 2017-08-06 ENCOUNTER — Other Ambulatory Visit: Payer: Self-pay

## 2017-08-06 ENCOUNTER — Encounter (HOSPITAL_COMMUNITY): Payer: Self-pay | Admitting: *Deleted

## 2017-08-06 ENCOUNTER — Telehealth: Payer: Self-pay | Admitting: Family Medicine

## 2017-08-06 ENCOUNTER — Inpatient Hospital Stay (HOSPITAL_COMMUNITY)
Admission: AD | Admit: 2017-08-06 | Discharge: 2017-08-06 | Disposition: A | Discharge status: Home / Self Care | Payer: Medicaid Other | Source: Ambulatory Visit | Attending: Obstetrics & Gynecology | Admitting: Obstetrics & Gynecology

## 2017-08-06 DIAGNOSIS — O99511 Diseases of the respiratory system complicating pregnancy, first trimester: Secondary | ICD-10-CM | POA: Diagnosis not present

## 2017-08-06 DIAGNOSIS — Z3A10 10 weeks gestation of pregnancy: Secondary | ICD-10-CM | POA: Insufficient documentation

## 2017-08-06 DIAGNOSIS — O219 Vomiting of pregnancy, unspecified: Secondary | ICD-10-CM

## 2017-08-06 DIAGNOSIS — J029 Acute pharyngitis, unspecified: Secondary | ICD-10-CM | POA: Diagnosis not present

## 2017-08-06 DIAGNOSIS — O210 Mild hyperemesis gravidarum: Secondary | ICD-10-CM | POA: Insufficient documentation

## 2017-08-06 DIAGNOSIS — J028 Acute pharyngitis due to other specified organisms: Secondary | ICD-10-CM

## 2017-08-06 DIAGNOSIS — R059 Cough, unspecified: Secondary | ICD-10-CM

## 2017-08-06 DIAGNOSIS — B9789 Other viral agents as the cause of diseases classified elsewhere: Secondary | ICD-10-CM

## 2017-08-06 DIAGNOSIS — R05 Cough: Secondary | ICD-10-CM | POA: Diagnosis not present

## 2017-08-06 LAB — URINALYSIS, ROUTINE W REFLEX MICROSCOPIC
Bilirubin Urine: NEGATIVE
Glucose, UA: NEGATIVE mg/dL
Hgb urine dipstick: NEGATIVE
KETONES UR: NEGATIVE mg/dL
Nitrite: NEGATIVE
PROTEIN: NEGATIVE mg/dL
Specific Gravity, Urine: 1.028 (ref 1.005–1.030)
pH: 6 (ref 5.0–8.0)

## 2017-08-06 MED ORDER — ONDANSETRON 4 MG PO TBDP: 4.0000 mg | ORAL_TABLET | Freq: Three times a day (TID) | ORAL | 0 refills | Status: DC | PRN

## 2017-08-06 MED ORDER — BENZONATATE 100 MG PO CAPS: 200.0000 mg | ORAL_CAPSULE | Freq: Three times a day (TID) | ORAL | 0 refills | Status: DC

## 2017-08-06 NOTE — MAU Provider Note (Signed)
History     CSN: [id number]  Arrival date and time: 08/06/17 1555   First Provider Initiated Contact with Patient 08/06/17 1750      Chief Complaint  Patient presents with  . Emesis  . Nausea  . Abdominal Pain  . Cough  . Sore Throat   HPI   [name] is a 27 y.o. female [phone] @ [redacted]w[redacted]d here in MAU with sore throat, cough, nausea and vomiting. Says she has been vomiting since she found out she was pregnant. Says she has been taking unisom and B6 which only helped for 2 days then stopped. Says she did get her flu shot this flu season. Has not taken anything over the counter for the URI symptoms because she was unsure of what she could take.   OB History    Gravida  5   Para  2   Term  2   Preterm  0   AB  2   Living  2     SAB  1   TAB  1   Ectopic      Multiple      Live Births           Obstetric Comments  SAB in 2017 at ~4w Elective AB at age 43 yo        Past Medical History:  Diagnosis Date  . Asthma     Past Surgical History:  Procedure Laterality Date  . WISDOM TOOTH EXTRACTION      Family History  Problem Relation Age of Onset  . Diabetes Father   . Diabetes Sister   . Diabetes Brother     Social History   Tobacco Use  . Smoking status: Never Smoker  . Smokeless tobacco: Never Used  Substance Use Topics  . Alcohol use: Not Currently    Comment: 1-2 times per week  . Drug use: No    Allergies: No Known Allergies  Medications Prior to Admission  Medication Sig Dispense Refill Last Dose  . albuterol (PROVENTIL HFA;VENTOLIN HFA) 108 (90 Base) MCG/ACT inhaler Inhale into the lungs every 6 (six) hours as needed for wheezing or shortness of breath.   prn  . Prenatal Vit-Fe Fumarate-FA (PRENATAL MULTIVITAMIN) TABS tablet Take 1 tablet by mouth daily at 12 noon.   08/06/2017 at Unknown time  . doxylamine, Sleep, (UNISOM) 25 MG tablet Take 1 tablet (25 mg total) by mouth at bedtime as needed (nausea in pregnancy). 30 tablet 0     Results for orders placed or performed during the hospital encounter of 08/06/17 (from the past 48 hour(s))  Urinalysis, Routine w reflex microscopic     Status: Abnormal   Collection Time: 08/06/17  4:48 PM  Result Value Ref Range   Color, Urine YELLOW YELLOW   APPearance HAZY (A) CLEAR   Specific Gravity, Urine 1.028 1.005 - 1.030   pH 6.0 5.0 - 8.0   Glucose, UA NEGATIVE NEGATIVE mg/dL   Hgb urine dipstick NEGATIVE NEGATIVE   Bilirubin Urine NEGATIVE NEGATIVE   Ketones, ur NEGATIVE NEGATIVE mg/dL   Protein, ur NEGATIVE NEGATIVE mg/dL   Nitrite NEGATIVE NEGATIVE   Leukocytes, UA SMALL (A) NEGATIVE   RBC / HPF 0-5 0 - 5 RBC/hpf   WBC, UA 6-10 0 - 5 WBC/hpf   Bacteria, UA RARE (A) NONE SEEN   Squamous Epithelial / LPF 6-10 0 - 5    Comment: Please note change in reference range.   Mucus PRESENT  Comment: Performed at [facility], [address] [place], [place] [address]  Culture, [place] Urine     Status: None   Collection Time: 08/06/17  4:48 PM  Result Value Ref Range   Specimen Description      OB CLEAN CATCH Performed at [facility], [address] [place], [place] [address]    Special Requests      Normal Performed at [facility], [address], [place], [place] [address]    Culture      NO GROWTH NO GROUP B STREP (S.AGALACTIAE) ISOLATED Performed at [facility], [address], [place], [place] [address]    Report Status 08/08/2017 FINAL     Review of Systems  Constitutional: Negative for chills and fever.  HENT: Positive for sore throat.   Respiratory: Positive for cough.    Physical Exam   Blood pressure 115/65, pulse 91, temperature 97.7 F (36.5 C), temperature source Oral, resp. rate 16, weight 180 lb 12 oz (82 kg), last menstrual period 05/22/2017, SpO2 100 %.  Physical Exam  Constitutional: She is oriented to person, place, and time. She appears well-developed and well-nourished.  Non-toxic appearance. She does not have a  sickly appearance. She does not appear ill. No distress.  HENT:  Head: Normocephalic.  Mouth/Throat: Posterior oropharyngeal erythema present. No oropharyngeal exudate, posterior oropharyngeal edema or tonsillar abscesses.  Cardiovascular: Normal rate.  Respiratory: Effort normal and breath sounds normal. No respiratory distress. She has no wheezes. She has no rales.  Musculoskeletal: Normal range of motion.  Neurological: She is alert and oriented to person, place, and time.  Skin: Skin is warm. She is not diaphoretic.  Psychiatric: Her behavior is normal.    MAU Course  Procedures  None  MDM  FHR 160's   Assessment and Plan   A:  1. Nausea and vomiting in pregnancy   2. Sore throat (viral)   3. Cough     P:  Discharge home in stable condition List of OTC medications given to the patient that are safe to use in pregnancy. Rx: Tessalon perles. Follow up with PCP if symptoms worsen Cool mist humidifier.   Venia Carbon I, NP 08/08/2017 1:48 PM

## 2017-08-06 NOTE — Telephone Encounter (Signed)
Pt is 11 weeks preganant. She has a cold and sore throat. She wants to know what she can take. Also her nausea medicine isnt working . Please advise

## 2017-08-06 NOTE — Discharge Instructions (Signed)
Cool Mist Vaporizer A cool mist vaporizer is a device that releases a cool mist into the air. If you have a cough or a cold, using a vaporizer may help relieve your symptoms. The mist adds moisture to the air, which may help thin your mucus and make it less sticky. When your mucus is thin and less sticky, it easier for you to breathe and to cough up secretions. Do not use a vaporizer if you are allergic to mold. Follow these instructions at home:  Follow the instructions that come with the vaporizer.  Do not use anything other than distilled water in the vaporizer.  Do not run the vaporizer all of the time. Doing that can cause mold or bacteria to grow in the vaporizer.  Clean the vaporizer after each time that you use it.  Clean and dry the vaporizer well before storing it.  Stop using the vaporizer if your breathing symptoms get worse. This information is not intended to replace advice given to you by your health care provider. Make sure you discuss any questions you have with your health care provider. Document Released: 12/23/2003 Document Revised: 10/15/2015 Document Reviewed: 06/26/2015 Elsevier Interactive Patient Education  2018 [employer] Safe Medications in Pregnancy   Acne:  Benzoyl Peroxide  Salicylic Acid   Backache/Headache:  Tylenol: 2 regular strength every 4 hours OR        2 Extra strength every 6 hours   Colds/Coughs/Allergies:  Benadryl (alcohol free) 25 mg every 6 hours as needed  Breath right strips  Claritin  Cepacol throat lozenges  Chloraseptic throat spray  Cold-Eeze- up to three times per day  Cough drops, alcohol free  Flonase (by prescription only)  Guaifenesin  Mucinex  Robitussin DM (plain only, alcohol free)  Saline nasal spray/drops  Sudafed (pseudoephedrine) & Actifed * use only after [redacted] weeks gestation and if you do not have high blood pressure  Tylenol  Vicks Vaporub  Zinc lozenges  Zyrtec   Constipation:  Colace    Ducolax suppositories  Fleet enema  Glycerin suppositories  Metamucil  Milk of magnesia  Miralax  Senokot  Smooth move tea   Diarrhea:  Kaopectate  Imodium A-D   *NO pepto Bismol   Hemorrhoids:  Anusol  Anusol HC  Preparation H  Tucks   Indigestion:  Tums  Maalox  Mylanta  Zantac  Pepcid   Insomnia:  Benadryl (alcohol free) 25mg  every 6 hours as needed  Tylenol PM  Unisom, no Gelcaps   Leg Cramps:  Tums  MagGel   Nausea/Vomiting:  Bonine  Dramamine  Emetrol  Ginger extract  Sea bands  Meclizine  Nausea medication to take during pregnancy:  Unisom (doxylamine succinate 25 mg tablets) Take one tablet daily at bedtime. If symptoms are not adequately controlled, the dose can be increased to a maximum recommended dose of two tablets daily (1/2 tablet in the morning, 1/2 tablet mid-afternoon and one at bedtime).  Vitamin B6 100mg  tablets. Take one tablet twice a day (up to 200 mg per day).   Skin Rashes:  Aveeno products  Benadryl cream or 25mg  every 6 hours as needed  Calamine Lotion  1% cortisone cream   Yeast infection:  Gyne-lotrimin 7  Monistat 7    **If taking multiple medications, please check labels to avoid duplicating the same active ingredients  **take medication as directed on the label  ** Do not exceed 4000 mg of tylenol in 24 hours  **Do not take medications that contain aspirin  or ibuprofen

## 2017-08-06 NOTE — MAU Note (Signed)
Has a sore throat, cough- productive- greenish mucous.  Denies fever. Continues to throw up. meds not working for [employer]  Feeling a lot of pressure 'down there', tender in lower abd.

## 2017-08-07 NOTE — Telephone Encounter (Signed)
She can take any of the following for cold and nausea. Thanks!  Safe Medications in Pregnancy   Colds/Coughs/Allergies:  Benadryl (alcohol free) 25 mg every 6 hours as needed  Breath right strips  Claritin  Cepacol throat lozenges  Chloraseptic throat spray  Cold-Eeze- up to three times per day  Cough drops, alcohol free  Flonase (by prescription only)  Guaifenesin  Mucinex  Robitussin DM (plain only, alcohol free)  Saline nasal spray/drops  Sudafed (pseudoephedrine) & Actifed * use only after [redacted] weeks gestation and if you do not have high blood pressure  Tylenol  Vicks Vaporub  Zinc lozenges  Zyrtec   Nausea/Vomiting:  Bonine  Dramamine  Emetrol  Ginger extract  Sea bands  Meclizine  Nausea medication to take during pregnancy:  Unisom (doxylamine succinate 25 mg tablets) Take one tablet daily at bedtime. If symptoms are not adequately controlled, the dose can be increased to a maximum recommended dose of two tablets daily (1/2 tablet in the morning, 1/2 tablet mid-afternoon and one at bedtime).  Vitamin B6  tablets. Take one tablet twice a day (up to 200 mg per day).   **If taking multiple medications, please check labels to avoid duplicating the same active ingredients  **take medication as directed on the label  ** Do not exceed 4000 mg of tylenol in 24 hours  **Do not take medications that contain aspirin or ibuprofen

## 2017-08-08 ENCOUNTER — Ambulatory Visit (INDEPENDENT_AMBULATORY_CARE_PROVIDER_SITE_OTHER): Payer: Medicaid Other | Admitting: Family Medicine

## 2017-08-08 ENCOUNTER — Other Ambulatory Visit: Payer: Self-pay

## 2017-08-08 VITALS — BP 100/58 | HR 88 | Temp 98.0°F | Wt 180.6 lb

## 2017-08-08 DIAGNOSIS — Z3687 Encounter for antenatal screening for uncertain dates: Secondary | ICD-10-CM

## 2017-08-08 LAB — CULTURE, OB URINE
CULTURE: NO GROWTH
SPECIAL REQUESTS: NORMAL

## 2017-08-08 MED ORDER — DOXYLAMINE-PYRIDOXINE 10-10 MG PO TBEC: 1.0000 | DELAYED_RELEASE_TABLET | Freq: Every day | ORAL | 1 refills | Status: DC

## 2017-08-08 MED ORDER — RANITIDINE HCL 75 MG PO TABS: 75.0000 mg | ORAL_TABLET | Freq: Two times a day (BID) | ORAL | 0 refills | Status: DC

## 2017-08-08 NOTE — Progress Notes (Signed)
Shakirah Kirkey is a 27 y.o. [id number] at [redacted]w[redacted]d here for routine follow up.  She reports nausea and URI, recently seen for this at MAU. Cold started Friday with dry cough, then lost voice a little on Friday, and then productive cough on Saturday. No fever, no rhinorrhea. +Sore throat. 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 Tried doxylamine, it helped for 2 days. She hasn't been taking this. Did not pick up rx from MAU yet as she just got her Medicaid card yesterday. Vomiting every morning, feels this is making her sore throat worse.   Dating: she thinks her LMP was 2/12, but isn't sure. She also says she feels larger than previous pregnancies and states her last pregnancy (in [place]) was given new dates after her [place] and baby was 10lbs.    See flow sheet for details.  Filed Weights   08/08/17 0842  Weight: 180 lb 9.6 oz (81.9 kg)   Vitals:   08/08/17 0842  BP: (!) 100/58  Pulse: 88  Temp: 98 F (36.7 C)   A/P: Pregnancy at [redacted]w[redacted]d.  Doing well.   Pregnancy issues include nausea, recent URI.  Patient is not interested in genetic screening. Stated she was to Dr. Natale Milch, but now tells me she thought this was required. When I explained that it was not, she said she would like to keep this pregnancy regardless of genetic screening results and thus declines. We can offer quad screen again at next visit.   Nausea- try diclegis again, counseled her that Medicaid may or may not cover this. Asked her to call with concerns. Also add Zantac for reflux component of nausea. Counseled that we will try zofran as a last resort given small increased risk of cardiac abnormalities.   URI - try warm salt gargle, honey tea. Wrote work not excusing her for today. Lungs clear, no sinus pain.   Dating - unsure LMP, ordered dating [place].   Bleeding and pain precautions reviewed. Follow up 4 weeks.   Loni Muse, MD

## 2017-08-08 NOTE — Telephone Encounter (Signed)
Contacted pt with no answer, VM left to return call. Please inform her of medications safe during pregnancy below, if she calls back. Or I can speak with her.

## 2017-08-08 NOTE — Patient Instructions (Addendum)
Try warm salt water for your throat, as well as honey with warm tea.   Use the medicine once at night for nausea, every night. Also take Zantac in the mornings. You can add another nausea pill in the morning if you need to.   Come back in 6 weeks for OB appt. Call if you have any concerns. I will call you about your ultrasound results.

## 2017-08-14 ENCOUNTER — Telehealth: Payer: Self-pay | Admitting: Internal Medicine

## 2017-08-14 NOTE — Telephone Encounter (Signed)
Pt called concerning a medication that was given to her for nausea at her appt last Wednesday. Pharmacy said they needed Dr Chanetta Marshall to sign off on it before she could get it. Pharmacy told patient they still have not gotten it signed off. Please advise

## 2017-08-15 ENCOUNTER — Telehealth (HOSPITAL_COMMUNITY): Payer: Self-pay | Admitting: Family Medicine

## 2017-08-15 ENCOUNTER — Ambulatory Visit (HOSPITAL_COMMUNITY)
Admission: RE | Admit: 2017-08-15 | Discharge: 2017-08-15 | Disposition: A | Discharge status: Home / Self Care | Payer: Medicaid Other | Source: Ambulatory Visit | Attending: Family Medicine | Admitting: Family Medicine

## 2017-08-15 DIAGNOSIS — Z3A12 12 weeks gestation of pregnancy: Secondary | ICD-10-CM | POA: Insufficient documentation

## 2017-08-15 DIAGNOSIS — Z3687 Encounter for antenatal screening for uncertain dates: Secondary | ICD-10-CM | POA: Diagnosis present

## 2017-08-15 DIAGNOSIS — O3411 Maternal care for benign tumor of corpus uteri, first trimester: Secondary | ICD-10-CM | POA: Diagnosis not present

## 2017-08-15 NOTE — Telephone Encounter (Signed)
Can we clarify with the pharmacy what "sign off on it" means? I sent the generic for diclegis. You can give a verbal order to substitute brand if medicaid will cover this.  Loni Muse, MD

## 2017-08-15 NOTE — Telephone Encounter (Signed)
Forwarded to Dr. Chanetta Marshall who saw patient for that encounter.   Kari Abernethy, MD, MPH PGY-3 [facility] Pager [phone]

## 2017-08-16 ENCOUNTER — Telehealth: Payer: Self-pay

## 2017-08-16 NOTE — Progress Notes (Signed)
See phone encounter for 08/16/17. Ples Specter, RN [facility] RN)

## 2017-08-16 NOTE — Telephone Encounter (Signed)
Spoke with [facility] to see if patient's Diclegis requires a PA as nothing has been received. Pharmacist stated that it did need PA.   Will route note to PCP with preferred alternative of Bonjesta.  Ples Specter, RN [facility] RN)

## 2017-08-17 MED ORDER — DOXYLAMINE-PYRIDOXINE ER 20-20 MG PO TBCR: 1.0000 | EXTENDED_RELEASE_TABLET | Freq: Every day | ORAL | 0 refills | Status: DC

## 2017-08-17 NOTE — Telephone Encounter (Signed)
Many thanks RN Team, I have sent bonjesta instead. I let her know about the new med, and also sent a mychart link so she can message [place] in the future.

## 2017-08-20 ENCOUNTER — Telehealth: Payer: Self-pay

## 2017-08-20 NOTE — Telephone Encounter (Signed)
PA for Samara Deist was denied. Will forward to MD Shawna Orleans, RN

## 2017-08-20 NOTE — Telephone Encounter (Signed)
Completed PA info in  Tracks for [employer].  Status pending.  Will recheck status in 24 hours. Shawna Orleans, RN

## 2017-08-20 NOTE — Telephone Encounter (Signed)
Received fax from [facility] pharmacy requesting prior authorization of [place] .  Form placed in MD's box for completion along with Medicaid formulary.  Shawna Orleans, RN

## 2017-08-20 NOTE — Telephone Encounter (Signed)
Filled out and return to RN box.

## 2017-08-20 NOTE — Telephone Encounter (Signed)
This was handled in a different phone message. Kari Sandoval, [name], [place]

## 2017-08-21 NOTE — Telephone Encounter (Signed)
Why was it denied? Did they comment on what I am supposed to use instead?

## 2017-08-21 NOTE — Telephone Encounter (Signed)
Awesome, thanks RN team for your help.

## 2017-08-21 NOTE — Telephone Encounter (Signed)
They did not provide a reason for denial- I called medicaid and spoke with someone in their pharmacy department- he states diclegis is still available and preferred but needed an override stating pt is pregnant. Called pharmacy and they still have some diclegis available and were able to fill it for her. Some pharmacies can get it some can't from what I understand. Hopefully she will be able to continue with diclegis for the next few months at least. Shawna Orleans, RN

## 2017-09-05 ENCOUNTER — Encounter: Payer: Medicaid Other | Admitting: Family Medicine

## 2017-09-17 ENCOUNTER — Encounter: Payer: Medicaid Other | Admitting: Family Medicine

## 2017-09-19 ENCOUNTER — Telehealth: Payer: Self-pay

## 2017-09-19 ENCOUNTER — Ambulatory Visit (INDEPENDENT_AMBULATORY_CARE_PROVIDER_SITE_OTHER): Payer: Self-pay | Admitting: Internal Medicine

## 2017-09-19 DIAGNOSIS — Z3490 Encounter for supervision of normal pregnancy, unspecified, unspecified trimester: Secondary | ICD-10-CM | POA: Insufficient documentation

## 2017-09-19 DIAGNOSIS — Z3492 Encounter for supervision of normal pregnancy, unspecified, second trimester: Secondary | ICD-10-CM

## 2017-09-19 NOTE — Telephone Encounter (Signed)
Pt called back to check on ultrasound appointment.I told her the date and time and she said due to work she did not think that would be okay and didn't know if that was her only option. She said later that same  week in the afternoon would be best. I told her it was most likely the first available appointment. She would like to be called back to know if she can reschedule or not. She said if she can not reschedule she will try her best to make this appointment.

## 2017-09-19 NOTE — Telephone Encounter (Signed)
Attempted to call patient to inform her of Ultrasound appointment on 10/03/2017 at [facility]. There was no answer and no voice mail.  If patient should happen to call back, please inform her of  the appointment date and time.  I will try calling later.  Glennie Hawk.Tiki Tucciarone R, CMA

## 2017-09-19 NOTE — Progress Notes (Signed)
Kari Sandoval is a 27 y.o. [id number] at 7222w1d for routine follow up.  She reports no complaints. No nausea at this time. No vaginal bleeding, no contractions, no loss fluid.  See flow sheet for details.  A/P: Pregnancy at 3322w1d.  Doing well.    Pregnancy issues include none   Round ligament pain noted  Anatomy ultrasound ordered to be scheduled at 18-19 weeks. Pt  is not interested in genetic screening. Bleeding and pain precautions reviewed. Follow up 4 weeks.

## 2017-09-19 NOTE — Addendum Note (Signed)
Addended by: Noralee CharsMIKELL, Sukhman Kocher Z on: 09/19/2017 12:11 PM   Modules accepted: Orders

## 2017-09-19 NOTE — Patient Instructions (Signed)
Second Trimester of Pregnancy The second trimester is from week 13 through week 28, month 4 through 6. This is often the time in pregnancy that you feel your best. Often times, morning sickness has lessened or quit. You may have more energy, and you may get hungry more often. Your unborn baby (fetus) is growing rapidly. At the end of the sixth month, he or she is about 9 inches long and weighs about 1 pounds. You will likely feel the baby move (quickening) between 18 and 20 weeks of pregnancy. Follow these instructions at home:  Avoid all smoking, herbs, and alcohol. Avoid drugs not approved by your doctor.  Do not use any tobacco products, including cigarettes, chewing tobacco, and electronic cigarettes. If you need help quitting, ask your doctor. You may get counseling or other support to help you quit.  Only take medicine as told by your doctor. Some medicines are safe and some are not during pregnancy.  Exercise only as told by your doctor. Stop exercising if you start having cramps.  Eat regular, healthy meals.  Wear a good support bra if your breasts are tender.  Do not use hot tubs, steam rooms, or saunas.  Wear your seat belt when driving.  Avoid raw meat, uncooked cheese, and liter boxes and soil used by cats.  Take your prenatal vitamins.  Take 1500-2000 milligrams of calcium daily starting at the 20th week of pregnancy until you deliver your baby.  Try taking medicine that helps you poop (stool softener) as needed, and if your doctor approves. Eat more fiber by eating fresh fruit, vegetables, and whole grains. Drink enough fluids to keep your pee (urine) clear or pale yellow.  Take warm water baths (sitz baths) to soothe pain or discomfort caused by hemorrhoids. Use hemorrhoid cream if your doctor approves.  If you have puffy, bulging veins (varicose veins), wear support hose. Raise (elevate) your feet for 15 minutes, 3-4 times a day. Limit salt in your diet.  Avoid heavy  lifting, wear low heals, and sit up straight.  Rest with your legs raised if you have leg cramps or low back pain.  Visit your dentist if you have not gone during your pregnancy. Use a soft toothbrush to brush your teeth. Be gentle when you floss.  You can have sex (intercourse) unless your doctor tells you not to.  Go to your doctor visits. Get help if:  You feel dizzy.  You have mild cramps or pressure in your lower belly (abdomen).  You have a nagging pain in your belly area.  You continue to feel sick to your stomach (nauseous), throw up (vomit), or have watery poop (diarrhea).  You have bad smelling fluid coming from your vagina.  You have pain with peeing (urination). Get help right away if:  You have a fever.  You are leaking fluid from your vagina.  You have spotting or bleeding from your vagina.  You have severe belly cramping or pain.  You lose or gain weight rapidly.  You have trouble catching your breath and have chest pain.  You notice sudden or extreme puffiness (swelling) of your face, hands, ankles, feet, or legs.  You have not felt the baby move in over an hour.  You have severe headaches that do not go away with medicine.  You have vision changes. This information is not intended to replace advice given to you by your health care provider. Make sure you discuss any questions you have with your health care   provider. Document Released: 06/21/2009 Document Revised: 09/02/2015 Document Reviewed: 05/28/2012 Elsevier Interactive Patient Education  2017 Elsevier Inc.  

## 2017-09-19 NOTE — Addendum Note (Signed)
Addended by: Gilberto BetterSIMPSON, MICHELLE R on: 09/19/2017 12:28 PM   Modules accepted: Orders

## 2017-09-20 NOTE — Telephone Encounter (Signed)
Attempted to call pt back, no answer or voicemail set up. If pt cant make her appt, she needs to call to reschedule, [phone]. Deseree Bruna PotterBlount, CMA

## 2017-09-24 ENCOUNTER — Encounter (HOSPITAL_COMMUNITY): Payer: Self-pay

## 2017-09-24 NOTE — Telephone Encounter (Signed)
Attempted to call patient to give her the number for scheduling if she could not mae appointment. No answer and no voicemail.   If patient should happen to call back, please give her the number to scheduling so that she can make an appointment that is convenient for her. Scheduling can be reached at [phone].  Thanks  .Glennie HawkSimpson, Michelle R, CMA

## 2017-10-01 ENCOUNTER — Ambulatory Visit (HOSPITAL_COMMUNITY)
Admission: RE | Admit: 2017-10-01 | Discharge: 2017-10-01 | Disposition: A | Discharge status: Home / Self Care | Payer: Medicaid Other | Source: Ambulatory Visit | Attending: Family Medicine | Admitting: Family Medicine

## 2017-10-01 DIAGNOSIS — Z363 Encounter for antenatal screening for malformations: Secondary | ICD-10-CM | POA: Insufficient documentation

## 2017-10-01 DIAGNOSIS — Z3492 Encounter for supervision of normal pregnancy, unspecified, second trimester: Secondary | ICD-10-CM | POA: Diagnosis not present

## 2017-10-03 ENCOUNTER — Other Ambulatory Visit (HOSPITAL_COMMUNITY): Payer: Self-pay

## 2017-10-22 ENCOUNTER — Ambulatory Visit (INDEPENDENT_AMBULATORY_CARE_PROVIDER_SITE_OTHER): Payer: Self-pay | Admitting: Family Medicine

## 2017-10-22 ENCOUNTER — Encounter: Payer: Self-pay | Admitting: Family Medicine

## 2017-10-22 ENCOUNTER — Other Ambulatory Visit: Payer: Self-pay

## 2017-10-22 VITALS — BP 96/66 | HR 80 | Temp 98.3°F | Wt 182.0 lb

## 2017-10-22 DIAGNOSIS — Z3492 Encounter for supervision of normal pregnancy, unspecified, second trimester: Secondary | ICD-10-CM

## 2017-10-22 NOTE — Progress Notes (Signed)
Kari PoliteLatoya Wetherington is a 27 y.o. [id number] at 968w6d here for routine follow up.  She reports active FM, some pelvic pressure with prolonged standing. Of note, somehow her pregnancy medicaid has lapsed, she has been going multiple times without resolution, she plans to go again this week.   See flow sheet for details.  A/P: Pregnancy at 8168w6d.  Doing well.   Pregnancy issues include none, needs PAP, she would like to do this at next visit.  Anatomy scan reviewed, problems are not noted.  Preterm labor precautions reviewed. Follow up 4 weeks.  Loni MuseKate Sarp Vernier, MD

## 2017-10-22 NOTE — Patient Instructions (Signed)
It was a pleasure to see you today! Thank you for choosing [facility] for your primary care. Kari Sandoval was seen for OB visit.   Our plans for today were:  Please let us know if there is anything we can do to help with Medicaid.   Try a pregnancy support belt for your pelvic pressure.   Best,  Dr. Chanetta Marshallimberlake

## 2017-11-23 ENCOUNTER — Other Ambulatory Visit (HOSPITAL_COMMUNITY)
Admission: RE | Admit: 2017-11-23 | Discharge: 2017-11-23 | Disposition: A | Discharge status: Home / Self Care | Payer: Medicaid Other | Source: Ambulatory Visit | Attending: Family Medicine | Admitting: Family Medicine

## 2017-11-23 ENCOUNTER — Ambulatory Visit (INDEPENDENT_AMBULATORY_CARE_PROVIDER_SITE_OTHER): Payer: Self-pay | Admitting: Family Medicine

## 2017-11-23 ENCOUNTER — Other Ambulatory Visit: Payer: Self-pay

## 2017-11-23 VITALS — BP 110/64 | HR 87 | Temp 98.1°F | Wt 182.0 lb

## 2017-11-23 DIAGNOSIS — Z124 Encounter for screening for malignant neoplasm of cervix: Secondary | ICD-10-CM | POA: Diagnosis present

## 2017-11-23 DIAGNOSIS — R87610 Atypical squamous cells of undetermined significance on cytologic smear of cervix (ASC-US): Secondary | ICD-10-CM | POA: Insufficient documentation

## 2017-11-23 DIAGNOSIS — Z3492 Encounter for supervision of normal pregnancy, unspecified, second trimester: Secondary | ICD-10-CM

## 2017-11-23 NOTE — Progress Notes (Signed)
Patient is complaining of 6/10 in left shoulder.  Hence Derrick,CMA

## 2017-11-23 NOTE — Patient Instructions (Addendum)
Thank you for coming to see me today. It was a pleasure! Today we:   Performed your pap smear, we will call you with your results.   You are due for your 1 hour glucose test. You will need to come during our regular office hours within the next 2 weeks.   Please follow-up in 4 weeks for your regular pregnancy check or as needed.  If you have any cramping/contractions, vaginal bleeding, fluid leaking, or are worried that baby is not moving normally, [facility].  If you have any questions or concerns, please do not hesitate to call the office at [phone].  Take Care,   [place] Aylinn Rydberg, DO

## 2017-11-23 NOTE — Progress Notes (Signed)
   PRENATAL VISIT NOTE  Subjective:  Kari Sandoval is a 27 y.o. [id number] at 5877w3d being seen today for ongoing prenatal care.  She reports active FM. She reports no complaints other than her chronic L shoulder pain. No nausea at this time. No vaginal bleeding, no contractions, no loss fluid.  See flow sheet for details.  Patient has had L shoulder pain for years, and it is better with massage. Worse at the end of the day. Has not taken any medications as she is unsure what to take. Reports tylenol did not help in the past but she is willing to try it. Does not interfere with her daily activities.    Objective:   Vitals:   11/23/17 1452  BP: 110/64  Pulse: 87  Temp: 98.1 F (36.7 C)  Weight: 182 lb (82.6 kg)    Fetal Status: Fundal height 26cm, FHR 135 bpm  General:  Alert, oriented and cooperative. Patient is in no acute distress.  Skin: Skin is warm and dry. No rash noted.   Cardiovascular: Normal heart rate noted  Respiratory: Normal respiratory effort, no problems with respiration noted  Abdomen: Soft, gravid, appropriate for gestational age.       Pelvic:  Cervical exam performed External genitalia within normal limits.  Vaginal mucosa pink, moist, normal rugae.  Nonfriable cervix without lesions, no discharge or bleeding noted on speculum exam.  Bimanual exam revealed normal, gravid uterus.  No cervical motion tenderness. Exam performed in the presence of a chaperone.      Extremities: Normal range of motion.     Mental Status: Normal mood and affect. Normal behavior. Normal judgment and thought content.   Assessment and Plan:  Pregnancy: A5W0981G5P2022 at 4777w3d  1. Routine cervical smear - Cytology - PAP([place])  2. Encounter for supervision of low-risk pregnancy in second trimester Pregnancy at 4843w6d. Doing well.   Pregnancy issues include none, needs 1 hour glucola, but patient does not have time today. Would like to come in during work one day. I have written a note to  excuse her to come over for this test within the next 2 weeks.  Anatomy scan reviewed, problems are not noted.  Preterm labor precautions reviewed. Follow up 4 weeks.  3. L Shoulder Pain: Patient instructed to continue with massages and to avoid NSAID's, but acetaminophen is ok as needed.   Preterm labor symptoms and general obstetric precautions including but not limited to vaginal bleeding, contractions, leaking of fluid and fetal movement were reviewed in detail with the patient.  Please refer to After Visit Summary for other counseling recommendations.   [place]Jordan Andersen Iorio, DO Family Medicine Resident PGY-2

## 2017-11-27 ENCOUNTER — Other Ambulatory Visit: Payer: Self-pay | Admitting: Family Medicine

## 2017-11-27 DIAGNOSIS — Z3492 Encounter for supervision of normal pregnancy, unspecified, second trimester: Secondary | ICD-10-CM

## 2017-11-27 NOTE — Progress Notes (Signed)
Placed future order for 1 hr.

## 2017-11-28 LAB — CYTOLOGY - PAP
DIAGNOSIS: UNDETERMINED — AB
HPV (WINDOPATH): NOT DETECTED

## 2017-12-03 ENCOUNTER — Other Ambulatory Visit (INDEPENDENT_AMBULATORY_CARE_PROVIDER_SITE_OTHER): Payer: Medicaid Other

## 2017-12-03 ENCOUNTER — Encounter: Payer: Self-pay | Admitting: Family Medicine

## 2017-12-03 DIAGNOSIS — Z3490 Encounter for supervision of normal pregnancy, unspecified, unspecified trimester: Secondary | ICD-10-CM | POA: Diagnosis present

## 2017-12-03 LAB — POCT 1 HR PRENATAL GLUCOSE: Glucose 1 Hr Prenatal, POC: 100 mg/dL

## 2017-12-19 ENCOUNTER — Inpatient Hospital Stay (HOSPITAL_COMMUNITY)
Admission: AD | Admit: 2017-12-19 | Discharge: 2017-12-19 | Disposition: A | Discharge status: Home / Self Care | Payer: Medicaid Other | Source: Ambulatory Visit | Attending: Obstetrics & Gynecology | Admitting: Obstetrics & Gynecology

## 2017-12-19 ENCOUNTER — Other Ambulatory Visit: Payer: Self-pay

## 2017-12-19 ENCOUNTER — Telehealth: Payer: Self-pay

## 2017-12-19 ENCOUNTER — Encounter (HOSPITAL_COMMUNITY): Payer: Self-pay | Admitting: *Deleted

## 2017-12-19 DIAGNOSIS — O99513 Diseases of the respiratory system complicating pregnancy, third trimester: Secondary | ICD-10-CM | POA: Insufficient documentation

## 2017-12-19 DIAGNOSIS — Z3A30 30 weeks gestation of pregnancy: Secondary | ICD-10-CM | POA: Insufficient documentation

## 2017-12-19 DIAGNOSIS — O368130 Decreased fetal movements, third trimester, not applicable or unspecified: Secondary | ICD-10-CM | POA: Diagnosis not present

## 2017-12-19 DIAGNOSIS — J45909 Unspecified asthma, uncomplicated: Secondary | ICD-10-CM | POA: Insufficient documentation

## 2017-12-19 DIAGNOSIS — Z79899 Other long term (current) drug therapy: Secondary | ICD-10-CM | POA: Insufficient documentation

## 2017-12-19 DIAGNOSIS — Z3689 Encounter for other specified antenatal screening: Secondary | ICD-10-CM

## 2017-12-19 NOTE — Telephone Encounter (Signed)
Pt called nurse line she is [redacted] weeks pregnant and has noticed over the past couple of days the baby's movement has decreased by about 30%. It states she feels him pressing against her, but not a lot of kicking. I informed pt to drink a lot of water and to please go to MAU. Pt stated she doesn't think she will be able to get off work until 4. I encouraged pt to go as soon as she is able.

## 2017-12-19 NOTE — MAU Note (Signed)
Baby not moving as much as usual.  First noted on Sunday night.

## 2017-12-19 NOTE — Discharge Instructions (Signed)

## 2017-12-19 NOTE — MAU Provider Note (Signed)
History     CSN: [id number]  Arrival date and time: 12/19/17 1706   First Provider Initiated Contact with Patient 12/19/17 1730      Chief Complaint  Patient presents with  . Decreased Fetal Movement   G5P2022 @30 .1 wks here with decreased FM. First noticed a difference 3 days ago. She is feeling less movements and less kicks. She is eating and drinking well. Denies VB, LOF, and ctx.   OB History    Gravida  5   Para  2   Term  2   Preterm  0   AB  2   Living  2     SAB  1   TAB  1   Ectopic      Multiple      Live Births           Obstetric Comments  SAB in 2017 at ~4w Elective AB at age 15 yo        Past Medical History:  Diagnosis Date  . Asthma     Past Surgical History:  Procedure Laterality Date  . WISDOM TOOTH EXTRACTION      Family History  Problem Relation Age of Onset  . Diabetes Father   . Diabetes Sister   . Diabetes Brother     Social History   Tobacco Use  . Smoking status: Never Smoker  . Smokeless tobacco: Never Used  Substance Use Topics  . Alcohol use: Not Currently    Comment: 1-2 times per week  . Drug use: No    Allergies: No Known Allergies  Medications Prior to Admission  Medication Sig Dispense Refill Last Dose  . albuterol (PROVENTIL HFA;VENTOLIN HFA) 108 (90 Base) MCG/ACT inhaler Inhale into the lungs every 6 (six) hours as needed for wheezing or shortness of breath.   Not Taking  . benzonatate (TESSALON) 100 MG capsule Take 2 capsules (200 mg total) by mouth every 8 (eight) hours. (Patient not taking: Reported on 11/23/2017) 21 capsule 0 Not Taking  . doxylamine, Sleep, (UNISOM) 25 MG tablet Take 1 tablet (25 mg total) by mouth at bedtime as needed (nausea in pregnancy). (Patient not taking: Reported on 11/23/2017) 30 tablet 0 Not Taking  . Doxylamine-Pyridoxine ER (BONJESTA) 20-20 MG TBCR Take 1 tablet by mouth at bedtime. May increase to 1 tab at bedtime and 1 tab in the morning if still nauseous. (Patient  not taking: Reported on 11/23/2017) 120 tablet 0 Not Taking  . ondansetron (ZOFRAN ODT) 4 MG disintegrating tablet Take 1 tablet (4 mg total) by mouth every 8 (eight) hours as needed for nausea or vomiting. (Patient not taking: Reported on 11/23/2017) 20 tablet 0 Not Taking  . Prenatal Vit-Fe Fumarate-FA (PRENATAL MULTIVITAMIN) TABS tablet Take 1 tablet by mouth daily at 12 noon.   Taking  . ranitidine (ZANTAC) 75 MG tablet Take 1 tablet (75 mg total) by mouth 2 (two) times daily. (Patient not taking: Reported on 11/23/2017) 90 tablet 0 Not Taking    Review of Systems  Gastrointestinal: Negative for abdominal pain.  Genitourinary: Negative for vaginal bleeding.   Physical Exam   Blood pressure 113/66, pulse 87, temperature 98.3 F (36.8 C), temperature source Oral, resp. rate 16, weight 82.1 kg, last menstrual period 05/22/2017, SpO2 99 %.  Physical Exam  Constitutional: She is oriented to person, place, and time. She appears well-developed and well-nourished. No distress.  HENT:  Head: Normocephalic and atraumatic.  Neck: Normal range of motion.  Cardiovascular: Normal rate.  Respiratory: Effort normal. No respiratory distress.  GI: Soft. She exhibits no distension. There is no tenderness.  gravid  Musculoskeletal: Normal range of motion.  Neurological: She is alert and oriented to person, place, and time.  Skin: Skin is warm and dry.  Psychiatric: She has a normal mood and affect.  EFM: 135 bpm, mod variability, + accels, no decels Toco: none  No results found for this or any previous visit (from the past 24 hour(s)).  MAU Course  Procedures  MDM NST reactive. Pt feeling good FM now. FM audible. Pt reassured. Stable for discharge home.  Assessment and Plan   1. [redacted] weeks gestation of pregnancy   2. NST (non-stress test) reactive   3. Decreased fetal movements in third trimester, single or unspecified fetus    Discharge home [facility] at [facility] next week as  scheduled FMCs  Allergies as of 12/19/2017   No Known Allergies     Medication List    STOP taking these medications   benzonatate 100 MG capsule Commonly known as:  TESSALON   doxylamine (Sleep) 25 MG tablet Commonly known as:  UNISOM   Doxylamine-Pyridoxine ER 20-20 MG Tbcr   ondansetron 4 MG disintegrating tablet Commonly known as:  ZOFRAN-ODT   ranitidine 75 MG tablet Commonly known as:  ZANTAC     TAKE these medications   albuterol 108 (90 Base) MCG/ACT inhaler Commonly known as:  PROVENTIL HFA;VENTOLIN HFA Inhale into the lungs every 6 (six) hours as needed for wheezing or shortness of breath.   prenatal multivitamin Tabs tablet Take 1 tablet by mouth daily at 12 noon.      Donette Larry, CNM 12/19/2017, 5:35 PM

## 2017-12-19 NOTE — Telephone Encounter (Signed)
Agree with MAU visit for fetal monitoring. Thanks.

## 2017-12-28 ENCOUNTER — Ambulatory Visit (INDEPENDENT_AMBULATORY_CARE_PROVIDER_SITE_OTHER): Payer: Medicaid Other | Admitting: Family Medicine

## 2017-12-28 VITALS — BP 110/80 | HR 76 | Temp 98.6°F | Wt 180.0 lb

## 2017-12-28 DIAGNOSIS — R87610 Atypical squamous cells of undetermined significance on cytologic smear of cervix (ASC-US): Secondary | ICD-10-CM

## 2017-12-28 DIAGNOSIS — Z3493 Encounter for supervision of normal pregnancy, unspecified, third trimester: Secondary | ICD-10-CM

## 2017-12-28 DIAGNOSIS — Z23 Encounter for immunization: Secondary | ICD-10-CM | POA: Diagnosis present

## 2017-12-28 MED ORDER — ALBUTEROL SULFATE HFA 108 (90 BASE) MCG/ACT IN AERS: 2.0000 | INHALATION_SPRAY | RESPIRATORY_TRACT | 1 refills | Status: AC | PRN

## 2017-12-28 NOTE — Patient Instructions (Signed)
Try to increase the amount of protein and calorie dense foods you are eating (like nuts, avocado, lentils, beans). We will recheck your weight next time. Please look into breaks at work and I am happy to write letters if needed for breastfeeding breaks.

## 2017-12-28 NOTE — Progress Notes (Signed)
Kathee PoliteLatoya Depolo is a 27 y.o. [id number] at 985w3d here for routine follow up.  She reports FM is more at home, less at work, less kicking but more moving. Reviewed MAU course with NST. Has asthma, feels this is worse lately. Went to use albuterol and it was expired.   Weight - she noticed she hasn't gained a lot. Has been doing smoothies in the morning, 3 meals and also snacks. Occasionally feels nauseous, but not consistently. Meat 3 times per week. Also eating salmon.   See flow sheet for details.  A/P: Pregnancy at 595w3d.  Doing well.   Pregnancy issues include low weight gain.  Infant feeding choice: breastfeeding - we discussed plans for pumping  Contraception choice: considering LARC  Infant circumcision desired: yes  Tdap was not given today. Patient hesitant, counseled on benefits, she says she will get at next visit.  Low weight gain - recheck at next visit and consider nutrition referral if persistent.   Preterm labor and fetal movement precautions reviewed. Safe sleep discussed. Follow up 2 week with OB clinic.  Loni MuseKate Timberlake, MD PGY 3 FM

## 2018-01-01 DIAGNOSIS — R87619 Unspecified abnormal cytological findings in specimens from cervix uteri: Secondary | ICD-10-CM | POA: Insufficient documentation

## 2018-01-15 ENCOUNTER — Other Ambulatory Visit: Payer: Self-pay

## 2018-01-15 ENCOUNTER — Ambulatory Visit (INDEPENDENT_AMBULATORY_CARE_PROVIDER_SITE_OTHER): Payer: Medicaid Other | Admitting: Family Medicine

## 2018-01-15 VITALS — BP 112/70 | HR 73 | Temp 97.6°F | Wt 181.0 lb

## 2018-01-15 DIAGNOSIS — O2613 Low weight gain in pregnancy, third trimester: Secondary | ICD-10-CM

## 2018-01-15 DIAGNOSIS — Z3493 Encounter for supervision of normal pregnancy, unspecified, third trimester: Secondary | ICD-10-CM

## 2018-01-15 DIAGNOSIS — Z23 Encounter for immunization: Secondary | ICD-10-CM

## 2018-01-15 MED ORDER — RHO D IMMUNE GLOBULIN 1500 UNIT/2ML IJ SOSY
300.0000 ug | PREFILLED_SYRINGE | Freq: Once | INTRAMUSCULAR | Status: AC
  Administered 2018-01-15: 300 ug via INTRAMUSCULAR

## 2018-01-15 NOTE — Patient Instructions (Signed)

## 2018-01-15 NOTE — Progress Notes (Signed)
Kari Sandoval is a 27 y.o. Z6X0960 at [redacted]w[redacted]d here for routine follow up.  She reports feeling well. She has tingling of her hands worse at night where she has to shake them to relieve symptoms. Occasionally with writing during the day as well.   See flow sheet for details.   +Phalens bilaterally on exam, normal strength and sensation of bilateral hands.   A/P: Pregnancy at [redacted]w[redacted]d.  Doing well.   Pregnancy issues include low weight gain.  Low weight gain - will get growth [place]. Measuring on track, good FM.   Carpal tunnel - start QHS wrist braces.   Infant feeding choice: breast Contraception choice: considering LARC Infant circumcision desired: yes  Rh factor repeated and Rhoham given today.  Tdap was given today.  Preterm labor and fetal movement precautions reviewed. Safe sleep discussed. Follow up 2 weeks.  Patient has not been able to make it to [facility] clinic, Dr. Manson Passey will review chart today.   Loni Muse, MD PGY 3 FM

## 2018-01-16 LAB — HIV ANTIBODY (ROUTINE TESTING W REFLEX): HIV SCREEN 4TH GENERATION: NONREACTIVE

## 2018-01-16 LAB — CBC
Hematocrit: 30.2 % — ABNORMAL LOW (ref 34.0–46.6)
Hemoglobin: 10.6 g/dL — ABNORMAL LOW (ref 11.1–15.9)
MCH: 30.4 pg (ref 26.6–33.0)
MCHC: 35.1 g/dL (ref 31.5–35.7)
MCV: 87 fL (ref 79–97)
PLATELETS: 155 10*3/uL (ref 150–450)
RBC: 3.49 x10E6/uL — AB (ref 3.77–5.28)
RDW: 12.7 % (ref 12.3–15.4)
WBC: 5.5 10*3/uL (ref 3.4–10.8)

## 2018-01-16 LAB — RPR: RPR Ser Ql: NONREACTIVE

## 2018-01-16 MED ORDER — WRIST BRACE MISC: 1.0000 [IU] | Freq: Every day | 0 refills | Status: DC

## 2018-01-17 ENCOUNTER — Encounter (HOSPITAL_COMMUNITY): Payer: Self-pay

## 2018-01-17 ENCOUNTER — Telehealth: Payer: Self-pay | Admitting: Family Medicine

## 2018-01-17 ENCOUNTER — Other Ambulatory Visit: Payer: Self-pay | Admitting: Family Medicine

## 2018-01-17 ENCOUNTER — Ambulatory Visit (HOSPITAL_COMMUNITY)
Admission: RE | Admit: 2018-01-17 | Discharge: 2018-01-17 | Disposition: A | Discharge status: Home / Self Care | Payer: Medicaid Other | Source: Ambulatory Visit | Attending: Family Medicine | Admitting: Family Medicine

## 2018-01-17 DIAGNOSIS — O2613 Low weight gain in pregnancy, third trimester: Secondary | ICD-10-CM

## 2018-01-17 DIAGNOSIS — O99513 Diseases of the respiratory system complicating pregnancy, third trimester: Secondary | ICD-10-CM | POA: Diagnosis not present

## 2018-01-17 DIAGNOSIS — Z3A34 34 weeks gestation of pregnancy: Secondary | ICD-10-CM | POA: Diagnosis not present

## 2018-01-17 DIAGNOSIS — J45909 Unspecified asthma, uncomplicated: Secondary | ICD-10-CM | POA: Insufficient documentation

## 2018-01-17 NOTE — Telephone Encounter (Signed)
Dr. Ina Homes from [facility] is calling to discuss this pt with Dr. Chanetta Marshall. He would like for her to call him back at [phone].

## 2018-01-17 NOTE — Telephone Encounter (Signed)
Returned call, was just looking at this scan with Dr. Manson Passey. Dr. Perry Mount wanted to discuss which type of order to get in the future.

## 2018-01-18 ENCOUNTER — Encounter: Payer: Self-pay | Admitting: Family Medicine

## 2018-01-21 ENCOUNTER — Telehealth: Payer: Self-pay | Admitting: Family Medicine

## 2018-01-21 MED ORDER — FERROUS SULFATE 325 (65 FE) MG PO TABS: 325.0000 mg | ORAL_TABLET | Freq: Every day | ORAL | 2 refills | Status: AC

## 2018-01-21 NOTE — Telephone Encounter (Addendum)
Called patient to discuss [place] and lab results - left generic VM to return call. Needs to add iron for low hemoglobin, I will send RX. She can use colace/docusate OTC if she becomes constipated. Ultrasound results were discussed w her by MFM at the time, but baby looks good.

## 2018-01-21 NOTE — Telephone Encounter (Signed)
Patient aware of results from phone message.   Patient also stating she is having a problem with hemorrhoids and wants to know what if anything, she can do for this?  Call back is [phone]  Ples Specter, RN [facility] RN)

## 2018-01-22 NOTE — Telephone Encounter (Signed)
If she is having problems with hemorrhoids, it may help to soften her stools. I would have her start the OTC colace/docusate. I do not recommend preparation H in pregnancy.

## 2018-01-23 ENCOUNTER — Other Ambulatory Visit: Payer: Self-pay | Admitting: Student in an Organized Health Care Education/Training Program

## 2018-01-23 ENCOUNTER — Telehealth: Payer: Self-pay | Admitting: Family Medicine

## 2018-01-23 DIAGNOSIS — K5900 Constipation, unspecified: Secondary | ICD-10-CM

## 2018-01-23 MED ORDER — DOCUSATE SODIUM 100 MG PO CAPS: 100.0000 mg | ORAL_CAPSULE | Freq: Two times a day (BID) | ORAL | 0 refills | Status: AC | PRN

## 2018-01-23 NOTE — Telephone Encounter (Signed)
Pt returned call and was informed about the stool softener. Would like to have the doctor call this into her pharmacy. jw

## 2018-01-23 NOTE — Telephone Encounter (Signed)
I sent colace to her pharmacy.

## 2018-01-23 NOTE — Telephone Encounter (Signed)
LVM to call office back to inform her of below.Zimmerman Rumple, Yang Rack D, CMA  

## 2018-01-24 ENCOUNTER — Inpatient Hospital Stay (HOSPITAL_COMMUNITY)
Admission: AD | Admit: 2018-01-24 | Discharge: 2018-01-24 | Disposition: A | Discharge status: Home / Self Care | Payer: Medicaid Other | Source: Ambulatory Visit | Attending: Obstetrics and Gynecology | Admitting: Obstetrics and Gynecology

## 2018-01-24 ENCOUNTER — Encounter (HOSPITAL_COMMUNITY): Payer: Self-pay | Admitting: *Deleted

## 2018-01-24 DIAGNOSIS — G5600 Carpal tunnel syndrome, unspecified upper limb: Secondary | ICD-10-CM | POA: Diagnosis not present

## 2018-01-24 DIAGNOSIS — O26899 Other specified pregnancy related conditions, unspecified trimester: Secondary | ICD-10-CM

## 2018-01-24 DIAGNOSIS — O99353 Diseases of the nervous system complicating pregnancy, third trimester: Secondary | ICD-10-CM | POA: Insufficient documentation

## 2018-01-24 DIAGNOSIS — O2613 Low weight gain in pregnancy, third trimester: Secondary | ICD-10-CM

## 2018-01-24 DIAGNOSIS — O479 False labor, unspecified: Secondary | ICD-10-CM

## 2018-01-24 DIAGNOSIS — Z3A35 35 weeks gestation of pregnancy: Secondary | ICD-10-CM | POA: Insufficient documentation

## 2018-01-24 DIAGNOSIS — O4703 False labor before 37 completed weeks of gestation, third trimester: Secondary | ICD-10-CM

## 2018-01-24 LAB — URINALYSIS, ROUTINE W REFLEX MICROSCOPIC
Bilirubin Urine: NEGATIVE
Glucose, UA: NEGATIVE mg/dL
HGB URINE DIPSTICK: NEGATIVE
KETONES UR: NEGATIVE mg/dL
NITRITE: NEGATIVE
PROTEIN: NEGATIVE mg/dL
Specific Gravity, Urine: 1.011 (ref 1.005–1.030)
pH: 6 (ref 5.0–8.0)

## 2018-01-24 MED ORDER — CEPHALEXIN 500 MG PO CAPS: 500.0000 mg | ORAL_CAPSULE | Freq: Three times a day (TID) | ORAL | 0 refills | Status: DC

## 2018-01-24 MED ORDER — WRIST BRACE/RIGHT MEDIUM MISC: 1.0000 | Freq: Every day | 0 refills | Status: DC

## 2018-01-24 MED ORDER — WRIST BRACE MISC: 1.0000 | Freq: Every day | 0 refills | Status: DC

## 2018-01-24 MED ORDER — WRIST BRACE/LEFT MEDIUM MISC: 1.0000 | Freq: Every day | 0 refills | Status: DC

## 2018-01-24 MED ORDER — COMFORT FIT MATERNITY SUPP MED MISC: 1.0000 | Freq: Every day | 0 refills | Status: DC

## 2018-01-24 MED ORDER — ENSURE PO LIQD: 237.0000 mL | Freq: Two times a day (BID) | ORAL | 12 refills | Status: DC

## 2018-01-24 NOTE — MAU Provider Note (Signed)
Chief Complaint:  Contractions and Diarrhea   First Provider Initiated Contact with Patient 01/24/18 [id number]      HPI: Kari Sandoval is a 27 y.o. [id number] at [redacted]w[redacted]d pt of [facility] who presents to maternity admissions reporting cramping/contractions 4-5 times per hour starting yesterday. She reports the cramping is intermittent, low in her abdomen and radiates to her low back. It is the same in intensity since onset. The baby is moving a lot this morning which is making her pain worse. The cramping is associated diarrhea, <3 episodes in 24 hours.  There are no other symptoms. She has not tried any treatments.  Pregnancy has been uncomplicated except for poor weight gain and carpal tunnel of both wrists. She is drinking Ensure and wrapping her wrists which helps some. She reports good fetal movement, denies LOF, vaginal bleeding, vaginal itching/burning, urinary symptoms, h/a, dizziness, n/v, or fever/chills.    HPI  Past Medical History: Past Medical History:  Diagnosis Date  . Asthma     Past obstetric history: OB History  Gravida Para Term Preterm AB Living  5 2 2  0 2 2  SAB TAB Ectopic Multiple Live Births  1 1          # Outcome Date GA Lbr Len/2nd Weight Sex Delivery Anes PTL Lv  5 Current           4 Term      Vag-Spont     3 Term      Vag-Spont     2 TAB           1 SAB             Obstetric Comments  SAB in 2017 at ~4w  Elective AB at age 1 yo    Past Surgical History: Past Surgical History:  Procedure Laterality Date  . WISDOM TOOTH EXTRACTION      Family History: Family History  Problem Relation Age of Onset  . Diabetes Father   . Diabetes Sister   . Diabetes Brother     Social History: Social History   Tobacco Use  . Smoking status: Never Smoker  . Smokeless tobacco: Never Used  Substance Use Topics  . Alcohol use: Not Currently    Comment: 1-2 times per week  . Drug use: No    Allergies: No Known Allergies  Meds:  No medications prior  to admission.    ROS:  Review of Systems  Constitutional: Negative for chills, fatigue and fever.  Eyes: Negative for visual disturbance.  Respiratory: Negative for shortness of breath.   Cardiovascular: Negative for chest pain.  Gastrointestinal: Positive for abdominal pain and diarrhea. Negative for nausea and vomiting.  Genitourinary: Positive for pelvic pain. Negative for difficulty urinating, dysuria, flank pain, vaginal bleeding, vaginal discharge and vaginal pain.  Musculoskeletal: Positive for back pain.  Neurological: Negative for dizziness and headaches.  Psychiatric/Behavioral: Negative.      I have reviewed patient's Past Medical Hx, Surgical Hx, Family Hx, Social Hx, medications and allergies.   Physical Exam   Patient Vitals for the past 24 hrs:  BP Temp Temp src Pulse Resp SpO2 Height Weight  01/24/18 1118 114/70 97.8 F (36.6 C) Oral 93 16 100 % - -  01/24/18 0901 - - - - - 100 % - -  01/24/18 0855 - - - - - 100 % - -  01/24/18 0840 - - - - - 100 % - -  01/24/18 0812 117/65 97.9 F (36.6  C) Oral 100 15 100 % 5\' 6"  (1.676 m) 82.1 kg   Constitutional: Well-developed, well-nourished female in no acute distress.  Cardiovascular: normal rate Respiratory: normal effort GI: Abd soft, non-tender, gravid appropriate for gestational age.  MS: Extremities nontender, no edema, normal ROM Neurologic: Alert and oriented x 4.  GU: Neg CVAT.    Dilation: 1.5 Effacement (%): 50 Cervical Position: Posterior Presentation: Vertex Exam by:: L. Leftwich-Kirby, CNM  FHT:  Baseline 125 , moderate variability, accelerations present, no decelerations Contractions: q 10-15 mins   Labs: Results for orders placed or performed during the hospital encounter of 01/24/18 (from the past 24 hour(s))  Urinalysis, Routine w reflex microscopic     Status: Abnormal   Collection Time: 01/24/18  8:06 AM  Result Value Ref Range   Color, Urine YELLOW YELLOW   APPearance CLOUDY (A) CLEAR    Specific Gravity, Urine 1.011 1.005 - 1.030   pH 6.0 5.0 - 8.0   Glucose, UA NEGATIVE NEGATIVE mg/dL   Hgb urine dipstick NEGATIVE NEGATIVE   Bilirubin Urine NEGATIVE NEGATIVE   Ketones, ur NEGATIVE NEGATIVE mg/dL   Protein, ur NEGATIVE NEGATIVE mg/dL   Nitrite NEGATIVE NEGATIVE   Leukocytes, UA LARGE (A) NEGATIVE   RBC / HPF 6-10 0 - 5 RBC/hpf   WBC, UA 21-50 0 - 5 WBC/hpf   Bacteria, UA RARE (A) NONE SEEN   Squamous Epithelial / LPF 21-50 0 - 5   Mucus PRESENT    B/Negative/-- (03/26 0950)  Imaging:    MAU Course/MDM: I have ordered labs and reviewed results.  NST reviewed and reactive Urine sent for culture Rx for Ensure for pt to take to [facility] and Rx for wrist braces x 2 for carpal tunnel Recheck of pt cervix with no cervical change in 1+ hour, contractions irregular and rare on toco so no evidence of preterm labor D/C home with preterm labor precautions Pt discharge with strict return precautions.  Today's evaluation included a work-up for preterm labor which can be life-threatening for both mom and baby.  Assessment: 1. Braxton Hicks contractions   2. Carpal tunnel syndrome during pregnancy   3. Poor weight gain of pregnancy, third trimester     Plan: Discharge home Labor precautions and fetal kick counts Follow-up Information    [facility] Follow up.   Why:  As scheduled, return to MAU as needed for emergencies Contact information: [address] [place] [address] [phone]         Allergies as of 01/24/2018   No Known Allergies     Medication List    TAKE these medications   albuterol 108 (90 Base) MCG/ACT inhaler Commonly known as:  PROVENTIL HFA;VENTOLIN HFA Inhale 2 puffs into the lungs every 4 (four) hours as needed for wheezing or shortness of breath.   cephALEXin 500 MG capsule Commonly known as:  KEFLEX Take 1 capsule (500 mg total) by mouth 3 (three) times daily.   docusate sodium 100 MG  capsule Commonly known as:  COLACE Take 1 capsule (100 mg total) by mouth 2 (two) times daily as needed for mild constipation.   ENSURE Take 237 mLs by mouth 2 (two) times daily between meals.   ferrous sulfate 325 (65 FE) MG tablet Take 1 tablet (325 mg total) by mouth daily with breakfast.   prenatal multivitamin Tabs tablet Take 1 tablet by mouth daily at 12 noon.   Wrist Brace Misc 1 Device by Does not apply  route daily. What changed:    how much to take  when to take this  additional instructions   Wrist Brace/Right Medium Misc 1 Device by Does not apply route at bedtime.   Wrist Brace/Left Medium Misc 1 Device by Does not apply route daily.   COMFORT FIT MATERNITY SUPP MED Misc 1 Device by Does not apply route daily.       Sharen Counter Certified Nurse-Midwife 01/24/2018 12:40 PM

## 2018-01-24 NOTE — Telephone Encounter (Signed)
LVM to call office back, please inform her of below. Lamonte Sakai, Taquanna Borras D, [place]

## 2018-01-24 NOTE — MAU Note (Signed)
Pt reports contractions since yesterday, diarrhea for the last 2 days, pressure in her lower abd.

## 2018-01-25 LAB — CULTURE, OB URINE: CULTURE: NO GROWTH

## 2018-01-28 ENCOUNTER — Inpatient Hospital Stay (HOSPITAL_COMMUNITY)
Admission: AD | Admit: 2018-01-28 | Discharge: 2018-01-28 | Disposition: A | Discharge status: Home / Self Care | Payer: Medicaid Other | Source: Ambulatory Visit | Attending: Obstetrics and Gynecology | Admitting: Obstetrics and Gynecology

## 2018-01-28 ENCOUNTER — Telehealth: Payer: Self-pay

## 2018-01-28 ENCOUNTER — Encounter (HOSPITAL_COMMUNITY): Payer: Self-pay

## 2018-01-28 DIAGNOSIS — O479 False labor, unspecified: Secondary | ICD-10-CM

## 2018-01-28 DIAGNOSIS — Z3A35 35 weeks gestation of pregnancy: Secondary | ICD-10-CM | POA: Diagnosis not present

## 2018-01-28 DIAGNOSIS — Z3689 Encounter for other specified antenatal screening: Secondary | ICD-10-CM

## 2018-01-28 DIAGNOSIS — O4703 False labor before 37 completed weeks of gestation, third trimester: Secondary | ICD-10-CM | POA: Diagnosis not present

## 2018-01-28 MED ORDER — ZOLPIDEM TARTRATE 5 MG PO TABS: 5.0000 mg | ORAL_TABLET | Freq: Every evening | ORAL | 0 refills | Status: DC | PRN

## 2018-01-28 MED ORDER — ACETAMINOPHEN 500 MG PO TABS
1000.0000 mg | ORAL_TABLET | Freq: Once | ORAL | Status: AC
  Administered 2018-01-28: 1000 mg via ORAL
  Filled 2018-01-28: qty 2

## 2018-01-28 NOTE — MAU Note (Signed)
Unable to get urine sample at this time.

## 2018-01-28 NOTE — Telephone Encounter (Signed)
Please let patient know that if she is having painful contractions, even if they are not consistently regular, she needs to be seen (likely in the MAU because they can monitor contractions), particularly since it has been several days since last check.

## 2018-01-28 NOTE — Telephone Encounter (Signed)
Patient aware. Alisa Brake, RN ([facility] RN)  

## 2018-01-28 NOTE — MAU Note (Addendum)
Pt has been having ctx since Wednesday but have been getting worse. Called the office and was told to come in. No bleeding or LOF, +FM

## 2018-01-28 NOTE — Telephone Encounter (Signed)
Patient seen at [facility] 01/24/18 for [employer] contractions. Patient calling that she is still having them, they are irregular but slightly stronger.   She was unable to count them at work but is staying hydrated. Wants to know how long she can expect them to continue, they have never been continuous like this and have been happening since last Wednesday.  Call back is [phone]  Ples Specter, RN [facility] RN)

## 2018-01-28 NOTE — MAU Provider Note (Signed)
History     CSN: [id number]  Arrival date and time: 01/28/18 1727   First Provider Initiated Contact with Patient 01/28/18 1757      Chief Complaint  Patient presents with  . Contractions   HPI  Kari Sandoval is a 27 y.o. [id number] at [redacted]w[redacted]d who presents to MAU with chief complaint of preterm contractions. This is a recurring problem, onset one week ago. Patient endorses bilateral lower abdominal contractions 3/10. Pain does not radiate, no aggravating or alleviating factors. She has not tried any treatments.  Patient denies vaginal bleeding, urinary symptoms, diarrhea,leaking of fluid, decreased fetal movement, fever, falls, or recent illness.    Patient receives prenatal care at [facility]. Here pregnancy is complicated by the following:  Patient Active Problem List   Diagnosis Date Noted  . Abnormal Pap smear of cervix 01/01/2018  . Encounter for supervision of low-risk pregnancy 09/19/2017  . Encounter for weight management 04/16/2017  . Menorrhagia 04/16/2017  . Pre-op evaluation 07/13/2016     OB History    Gravida  5   Para  2   Term  2   Preterm  0   AB  2   Living  2     SAB  1   TAB  1   Ectopic      Multiple      Live Births           Obstetric Comments  SAB in 2017 at ~4w Elective AB at age 48 yo        Past Medical History:  Diagnosis Date  . Asthma     Past Surgical History:  Procedure Laterality Date  . WISDOM TOOTH EXTRACTION      Family History  Problem Relation Age of Onset  . Diabetes Father   . Diabetes Sister   . Diabetes Brother     Social History   Tobacco Use  . Smoking status: Never Smoker  . Smokeless tobacco: Never Used  Substance Use Topics  . Alcohol use: Not Currently    Comment: 1-2 times per week  . Drug use: No    Allergies: No Known Allergies  Medications Prior to Admission  Medication Sig Dispense Refill Last Dose  . albuterol (PROVENTIL HFA;VENTOLIN HFA) 108 (90 Base) MCG/ACT  inhaler Inhale 2 puffs into the lungs every 4 (four) hours as needed for wheezing or shortness of breath. 1 Inhaler 1 01/23/2018 at Unknown time  . cephALEXin (KEFLEX) 500 MG capsule Take 1 capsule (500 mg total) by mouth 3 (three) times daily. 21 capsule 0   . docusate sodium (COLACE) 100 MG capsule Take 1 capsule (100 mg total) by mouth 2 (two) times daily as needed for mild constipation. 10 capsule 0   . Elastic Bandages & Supports (COMFORT FIT MATERNITY SUPP MED) MISC 1 Device by Does not apply route daily. 1 each 0   . Elastic Bandages & Supports (WRIST BRACE/LEFT MEDIUM) MISC 1 Device by Does not apply route daily. 1 each 0   . Elastic Bandages & Supports (WRIST BRACE/RIGHT MEDIUM) MISC 1 Device by Does not apply route at bedtime. 1 each 0   . ENSURE (ENSURE) Take 237 mLs by mouth 2 (two) times daily between meals. 237 mL 12   . ferrous sulfate 325 (65 FE) MG tablet Take 1 tablet (325 mg total) by mouth daily with breakfast. 90 tablet 2   . Misc. Devices (WRIST BRACE) MISC 1 Device by Does not apply route daily. 1  each 0   . Prenatal Vit-Fe Fumarate-FA (PRENATAL MULTIVITAMIN) TABS tablet Take 1 tablet by mouth daily at 12 noon.   01/23/2018 at Unknown time    Review of Systems  Constitutional: Negative for chills, fatigue and fever.  Gastrointestinal: Positive for abdominal pain. Negative for diarrhea, nausea and vomiting.  Genitourinary: Negative for difficulty urinating, dyspareunia, dysuria, flank pain, vaginal bleeding, vaginal discharge and vaginal pain.  Musculoskeletal: Negative for back pain.  Neurological: Negative for headaches.  All other systems reviewed and are negative.  Physical Exam   Blood pressure 120/69, pulse 97, temperature 97.6 F (36.4 C), temperature source Oral, resp. rate 16, weight 83.6 kg, last menstrual period 05/22/2017.  Physical Exam  Nursing note and vitals reviewed. Constitutional: She is oriented to person, place, and time. She appears  well-developed and well-nourished.  Cardiovascular: Normal rate and intact distal pulses.  Respiratory: Effort normal. No respiratory distress.  GI: Soft. She exhibits no distension. There is no tenderness. There is no rebound and no guarding.  Gravid  Genitourinary: Vagina normal and uterus normal.  Genitourinary Comments: SVE: 1.5/thick/posterior unchanged from previous exam  Neurological: She is alert and oriented to person, place, and time. She has normal reflexes.  Skin: Skin is warm and dry.  Psychiatric: She has a normal mood and affect. Her behavior is normal. Judgment and thought content normal.    MAU Course  Procedures  MDM --Braxton Hicks contractions, discomfort reduced with Tylenol given in MAU --Reactive fetal tracing: baseline 125, moderate variability, positive accelerations, no decelerations --Toco: occasional contractions, palpate mild, uterine irritability  Patient Vitals for the past 24 hrs:  BP Temp Temp src Pulse Resp Weight  01/28/18 1856 130/72 - - 92 - -  01/28/18 1855 - - - - 16 -  01/28/18 1752 120/69 97.6 F (36.4 C) Oral 97 16 -  01/28/18 1748 - - - - - 83.6 kg    Meds ordered this encounter  Medications  . acetaminophen (TYLENOL) tablet 1,000 mg  . zolpidem (AMBIEN) 5 MG tablet    Sig: Take 1 tablet (5 mg total) by mouth at bedtime as needed for sleep.    Dispense:  5 tablet    Refill:  0    Order Specific Question:   Supervising Provider    Answer:   Samara Snide     Assessment and Plan  --27 y.o. [id number] at [redacted]w[redacted]d  --Preterm contractions without cervical change --Reactive fetal tracing --Rx Ambien, short course to assist with falling asleep --Discharge home in stable condition  F/U: OB appt Thursday 01/31/18  Calvert Cantor, CNM 01/28/2018, 7:08 PM

## 2018-01-28 NOTE — Discharge Instructions (Signed)
Braxton Hicks Contractions °Contractions of the uterus can occur throughout pregnancy, but they are not always a sign that you are in labor. You may have practice contractions called Braxton Hicks contractions. These false labor contractions are sometimes confused with true labor. °What are Braxton Hicks contractions? °Braxton Hicks contractions are tightening movements that occur in the muscles of the uterus before labor. Unlike true labor contractions, these contractions do not result in opening (dilation) and thinning of the cervix. Toward the end of pregnancy (32-34 weeks), Braxton Hicks contractions can happen more often and may become stronger. These contractions are sometimes difficult to tell apart from true labor because they can be very uncomfortable. You should not feel embarrassed if you go to the hospital with false labor. °Sometimes, the only way to tell if you are in true labor is for your health care provider to look for changes in the cervix. The health care provider will do a physical exam and may monitor your contractions. If you are not in true labor, the exam should show that your cervix is not dilating and your water has not broken. °If there are other health problems associated with your pregnancy, it is completely safe for you to be sent home with false labor. You may continue to have Braxton Hicks contractions until you go into true labor. °How to tell the difference between true labor and false labor °True labor °· Contractions last 30-70 seconds. °· Contractions become very regular. °· Discomfort is usually felt in the top of the uterus, and it spreads to the lower abdomen and low back. °· Contractions do not go away with walking. °· Contractions usually become more intense and increase in frequency. °· The cervix dilates and gets thinner. °False labor °· Contractions are usually shorter and not as strong as true labor contractions. °· Contractions are usually irregular. °· Contractions  are often felt in the front of the lower abdomen and in the groin. °· Contractions may go away when you walk around or change positions while lying down. °· Contractions get weaker and are shorter-lasting as time goes on. °· The cervix usually does not dilate or become thin. °Follow these instructions at home: °· Take over-the-counter and prescription medicines only as told by your health care provider. °· Keep up with your usual exercises and follow other instructions from your health care provider. °· Eat and drink lightly if you think you are going into labor. °· If Braxton Hicks contractions are making you uncomfortable: °? Change your position from lying down or resting to walking, or change from walking to resting. °? Sit and rest in a tub of warm water. °? Drink enough fluid to keep your urine pale yellow. Dehydration may cause these contractions. °? Do slow and deep breathing several times an hour. °· Keep all follow-up prenatal visits as told by your health care provider. This is important. °Contact a health care provider if: °· You have a fever. °· You have continuous pain in your abdomen. °Get help right away if: °· Your contractions become stronger, more regular, and closer together. °· You have fluid leaking or gushing from your vagina. °· You pass blood-tinged mucus (bloody show). °· You have bleeding from your vagina. °· You have low back pain that you never had before. °· You feel your baby’s head pushing down and causing pelvic pressure. °· Your baby is not moving inside you as much as it used to. °Summary °· Contractions that occur before labor are called Braxton   Hicks contractions, false labor, or practice contractions. °· Braxton Hicks contractions are usually shorter, weaker, farther apart, and less regular than true labor contractions. True labor contractions usually become progressively stronger and regular and they become more frequent. °· Manage discomfort from Braxton Hicks contractions by  changing position, resting in a warm bath, drinking plenty of water, or practicing deep breathing. °This information is not intended to replace advice given to you by your health care provider. Make sure you discuss any questions you have with your health care provider. °Document Released: 08/10/2016 Document Revised: 08/10/2016 Document Reviewed: 08/10/2016 °Elsevier Interactive Patient Education © 2018 Elsevier Inc. ° °

## 2018-01-31 ENCOUNTER — Other Ambulatory Visit (HOSPITAL_COMMUNITY)
Admission: RE | Admit: 2018-01-31 | Discharge: 2018-01-31 | Disposition: A | Discharge status: Home / Self Care | Payer: Medicaid Other | Source: Ambulatory Visit | Attending: Family Medicine | Admitting: Family Medicine

## 2018-01-31 ENCOUNTER — Ambulatory Visit (INDEPENDENT_AMBULATORY_CARE_PROVIDER_SITE_OTHER): Payer: Medicaid Other | Admitting: Family Medicine

## 2018-01-31 ENCOUNTER — Telehealth: Payer: Self-pay | Admitting: Family Medicine

## 2018-01-31 ENCOUNTER — Encounter: Payer: Self-pay | Admitting: Family Medicine

## 2018-01-31 ENCOUNTER — Other Ambulatory Visit: Payer: Self-pay

## 2018-01-31 VITALS — BP 114/80 | HR 100 | Temp 98.0°F | Wt 185.0 lb

## 2018-01-31 DIAGNOSIS — O26899 Other specified pregnancy related conditions, unspecified trimester: Secondary | ICD-10-CM

## 2018-01-31 DIAGNOSIS — Z6791 Unspecified blood type, Rh negative: Secondary | ICD-10-CM

## 2018-01-31 DIAGNOSIS — Z3493 Encounter for supervision of normal pregnancy, unspecified, third trimester: Secondary | ICD-10-CM

## 2018-01-31 NOTE — Patient Instructions (Signed)
Rh Incompatibility Rh incompatibility is a condition that occurs during pregnancy if a woman has Rh-negative blood and her baby has Rh-positive blood. "Rh-negative" and "Rh-positive" refer to whether or not the blood has an Rh factor. An Rh factor is a specific protein found on the surface of red blood cells. If a woman has Rh factor, she is Rh-positive. If she does not have an Rh factor, she is Rh-negative. Having or not having an Rh factor does not affect the mother's general health. However, it can cause problems during pregnancy. What kind of problems can Rh incompatibility cause? During pregnancy, blood from the baby can cross into the mother's bloodstream, especially during delivery. If a mother is Rh-negative and the baby is Rh-positive, the mother's defense system will react to the baby's blood as if it was a foreign substance and will create proteins (antibodies). This is called sensitization. Once the mother is sensitized, her Rh antibodies will cross the placenta to the baby and attack the baby's Rh-positive blood as if it is a harmful substance. Rh incompatibility can also happen if the Rh-negative pregnant woman is exposed to the Rh factor during a blood transfusion with Rh-positive blood. How does this condition affect my baby? The Rh antibodies that attack and destroy the baby's red blood cells can lead to hemolytic disease in the baby. Hemolytic disease is when the red blood cells break down. This can cause:  Yellowing of the skin and eyes (jaundice).  The body to not have enough healthy red blood cells (anemia).  Brain damage.  Heart failure.  Death.  These antibodies usually do not cause problems during a first pregnancy. This is because the blood from the baby often times crosses into the mother's bloodstream during delivery, and the baby is born before many of the antibodies can develop. However, the antibodies stay in your body once they have formed. Because of this, Rh  incompatibility is more likely to cause problems in second or later pregnancies (if the baby is Rh-positive). How is this diagnosed? When a woman becomes pregnant, blood tests may be done to find out her blood type and Rh factor. If the woman is Rh-negative, she also may have another blood test called an antibody screen. The antibody screen shows whether she has Rh antibodies in her blood. If she does, it means she was exposed to Rh-positive blood before, and she is at risk for Rh incompatibility. To find out whether the baby is developing hemolytic anemia and how serious it is, caregivers may use more advanced tests, such as ultrasonography (commonly known as ultrasound). How is Rh incompatibility treated? Rh incompatibility is treated with a shot of medicine called Rho (D) immune globulin. This medicine keeps the woman's body from making antibodies that can cause serious problems in the baby or future babies. Two shots will be given, one at around your seventh month of pregnancy and the other within 72 hours of your baby being born. If you are Rh-negative, you will need this medicine every time you have a baby with Rh-positive blood. If you already have antibodies in your blood, Rho (D) immune globulin will not help. Your doctor will not give you this medicine, but will watch your pregnancy closely for problems instead. This shot may also be given to an Rh-negative woman when the risk of blood transfer between the mom and baby is high. The risk is high with:  An amniocentesis.  A miscarriage or an abortion.  An ectopic pregnancy.  Any   vaginal bleeding during pregnancy.  This information is not intended to replace advice given to you by your health care provider. Make sure you discuss any questions you have with your health care provider. Document Released: 09/16/2001 Document Revised: 09/02/2015 Document Reviewed: 07/09/2012 Elsevier Interactive Patient Education  2017 Elsevier Inc.  

## 2018-01-31 NOTE — Telephone Encounter (Signed)
FMLA form dropped off for at front desk for completion.  Verified that patient section of form has been completed.  Last DOS/WCC with PCP was 01/15/18.  Placed form in team folder to be completed by clinical staff.  Chari Manning

## 2018-01-31 NOTE — Progress Notes (Signed)
Kari Sandoval is a 27 y.o. Z6X0960 at [redacted]w[redacted]d for routine follow up.  She reports: Intermittent contraction which has slowed down.  See flow sheet for details.  A/P: Pregnancy at [redacted]w[redacted]d.  Doing well.   Pregnancy issues include: Poor weight gain improved today  Infant feeding choice:Breastfeed exclusively Contraception choice:Mirena Infant circumcision desired yes  Tdapwas not given today. GBS/GC/CZ testing was performed today.  Preterm labor precautions reviewed. Safe sleep discussed. Kick counts reviewed. Follow up 2 weeks.  Note that although she was given Rhogam during last visit, antibody screening was not done. We touch based with OB attending. Kari Sandoval spoke directly with Kari Sandoval who recommended repeating now instead of waiting till delivery.  Antibody screen checked today. May be falsely +.  Continue nutrition per recommendation.

## 2018-01-31 NOTE — Telephone Encounter (Signed)
Clinical info completed on FMLA form.  Place form in Dr. Timberlake's box for completion.  Zimmerman Rumple, Tom Macpherson D, CMA  

## 2018-02-01 ENCOUNTER — Telehealth: Payer: Self-pay | Admitting: Family Medicine

## 2018-02-01 LAB — CERVICOVAGINAL ANCILLARY ONLY
Chlamydia: NEGATIVE
Neisseria Gonorrhea: NEGATIVE

## 2018-02-01 NOTE — Telephone Encounter (Signed)
I called and discussed + antibody screen with the patient, which could be due to the Rhogam injection. I advised her that she will need to repeat the test. The plan was to schedule a lab appointment next week. However, I told her that I needed to contact the lab first to clarify the test result and future order before lab appointment is scheduled.   I called [facility] to discuss the result and spoke with Asher Muir. The patient is anti D weakly positive. Asher Muir stated that it would be too soon to recheck the titer. She recommended waiting for about a month, which aligns with recommendations.  I attempted to reach the patient regarding the need for a repeat antibody screen in 4 weeks with titer. She did not pick up her call this time. HIPAA compliant callback message left.   I will forward this note to Dr. Chanetta Marshall to follow.  I will also copy Dr. Tamsen Roers to see if there is anything else to be done at this time.    Ref Range & Units 1d ago (01/31/18) 1d ago (01/31/18) 54mo ago (07/03/17)  Antibody Screen Negative PositiveAbnormal   See Final Results  Negative   Antibody Id. #1  Anti-D     [place] Titer #1  Comment     Comment: The antibody is too weak to titer at this time.  If a numerical titer result has been reported, please note that this  result is the reciprocal value of titer results formerly reported as  1:2,1:4, 1:8, etc. These results are now reported as 2, 4, 8, etc.  The [employer] of [employer] Salomon Fick has recommended this change  in titer reporting formats to simply reflect the reciprocal value of

## 2018-02-04 LAB — ANTIBODY SCREEN

## 2018-02-04 LAB — AB SCR+ANTIBODY ID: Antibody Screen: POSITIVE — AB

## 2018-02-04 LAB — CULTURE, BETA STREP (GROUP B ONLY): Strep Gp B Culture: NEGATIVE

## 2018-02-04 NOTE — Telephone Encounter (Signed)
Thank you for your input!

## 2018-02-05 NOTE — Telephone Encounter (Signed)
Called patient asking to clarify how many weeks of FMLA she will use and when her approximate return date will be. Left VM.

## 2018-02-06 NOTE — Telephone Encounter (Signed)
Pt informed that FMLA paperwork was ready for pickup at the front desk.   Copy placed in batch scanning. Khari Mally, Maryjo Rochester, CMA

## 2018-02-06 NOTE — Telephone Encounter (Signed)
Form placed in RN box.  Patient saw me in the parking lot and said she wants 6 weeks of leave and I filled out form accordingly.

## 2018-02-10 ENCOUNTER — Encounter (HOSPITAL_COMMUNITY): Payer: Self-pay

## 2018-02-10 ENCOUNTER — Inpatient Hospital Stay (HOSPITAL_COMMUNITY): Payer: Medicaid Other | Admitting: Anesthesiology

## 2018-02-10 ENCOUNTER — Other Ambulatory Visit: Payer: Self-pay

## 2018-02-10 ENCOUNTER — Inpatient Hospital Stay (HOSPITAL_COMMUNITY)
Admission: AD | Admit: 2018-02-10 | Discharge: 2018-02-12 | DRG: 807 | Disposition: A | Discharge status: Home / Self Care | Payer: Medicaid Other | Attending: Obstetrics and Gynecology | Admitting: Obstetrics and Gynecology

## 2018-02-10 DIAGNOSIS — O9952 Diseases of the respiratory system complicating childbirth: Secondary | ICD-10-CM | POA: Diagnosis present

## 2018-02-10 DIAGNOSIS — Z3483 Encounter for supervision of other normal pregnancy, third trimester: Secondary | ICD-10-CM | POA: Diagnosis present

## 2018-02-10 DIAGNOSIS — Z3A37 37 weeks gestation of pregnancy: Secondary | ICD-10-CM | POA: Diagnosis not present

## 2018-02-10 DIAGNOSIS — J45909 Unspecified asthma, uncomplicated: Secondary | ICD-10-CM | POA: Diagnosis present

## 2018-02-10 LAB — CBC
HCT: 32.8 % — ABNORMAL LOW (ref 36.0–46.0)
Hemoglobin: 11.3 g/dL — ABNORMAL LOW (ref 12.0–15.0)
MCH: 30.6 pg (ref 26.0–34.0)
MCHC: 34.5 g/dL (ref 30.0–36.0)
MCV: 88.9 fL (ref 80.0–100.0)
NRBC: 0 % (ref 0.0–0.2)
PLATELETS: 147 10*3/uL — AB (ref 150–400)
RBC: 3.69 MIL/uL — AB (ref 3.87–5.11)
RDW: 13.2 % (ref 11.5–15.5)
WBC: 6.9 10*3/uL (ref 4.0–10.5)

## 2018-02-10 MED ORDER — ACETAMINOPHEN 325 MG PO TABS
650.0000 mg | ORAL_TABLET | ORAL | Status: DC | PRN
  Administered 2018-02-10 – 2018-02-11 (×5): 650 mg via ORAL
  Filled 2018-02-10 (×4): qty 2

## 2018-02-10 MED ORDER — LACTATED RINGERS IV SOLN: 500.0000 mL | INTRAVENOUS | Status: DC | PRN

## 2018-02-10 MED ORDER — DIPHENHYDRAMINE HCL 50 MG/ML IJ SOLN: 12.5000 mg | INTRAMUSCULAR | Status: DC | PRN

## 2018-02-10 MED ORDER — LACTATED RINGERS IV SOLN
INTRAVENOUS | Status: DC
  Administered 2018-02-10: 10:00:00 via INTRAVENOUS

## 2018-02-10 MED ORDER — ACETAMINOPHEN 325 MG PO TABS
650.0000 mg | ORAL_TABLET | ORAL | Status: DC | PRN
  Filled 2018-02-10: qty 2

## 2018-02-10 MED ORDER — OXYCODONE-ACETAMINOPHEN 5-325 MG PO TABS: 1.0000 | ORAL_TABLET | ORAL | Status: DC | PRN

## 2018-02-10 MED ORDER — WITCH HAZEL-GLYCERIN EX PADS
1.0000 "application " | MEDICATED_PAD | CUTANEOUS | Status: DC | PRN
  Administered 2018-02-11: 1 via TOPICAL

## 2018-02-10 MED ORDER — EPHEDRINE 5 MG/ML INJ: 10.0000 mg | INTRAVENOUS | Status: DC | PRN

## 2018-02-10 MED ORDER — FENTANYL 2.5 MCG/ML BUPIVACAINE 1/10 % EPIDURAL INFUSION (WH - ANES)
14.0000 mL/h | INTRAMUSCULAR | Status: DC | PRN
  Administered 2018-02-10: 14 mL/h via EPIDURAL
  Filled 2018-02-10: qty 100

## 2018-02-10 MED ORDER — LACTATED RINGERS IV SOLN
500.0000 mL | Freq: Once | INTRAVENOUS | Status: AC
  Administered 2018-02-10: 500 mL via INTRAVENOUS

## 2018-02-10 MED ORDER — LIDOCAINE HCL (PF) 1 % IJ SOLN
30.0000 mL | INTRAMUSCULAR | Status: DC | PRN
  Filled 2018-02-10: qty 30

## 2018-02-10 MED ORDER — SENNOSIDES-DOCUSATE SODIUM 8.6-50 MG PO TABS
2.0000 | ORAL_TABLET | ORAL | Status: DC
  Administered 2018-02-11 – 2018-02-12 (×2): 2 via ORAL
  Filled 2018-02-10 (×2): qty 2

## 2018-02-10 MED ORDER — ONDANSETRON HCL 4 MG PO TABS: 4.0000 mg | ORAL_TABLET | ORAL | Status: DC | PRN

## 2018-02-10 MED ORDER — ONDANSETRON HCL 4 MG/2ML IJ SOLN: 4.0000 mg | Freq: Four times a day (QID) | INTRAMUSCULAR | Status: DC | PRN

## 2018-02-10 MED ORDER — IBUPROFEN 600 MG PO TABS
600.0000 mg | ORAL_TABLET | Freq: Four times a day (QID) | ORAL | Status: DC
  Administered 2018-02-10 – 2018-02-12 (×8): 600 mg via ORAL
  Filled 2018-02-10 (×8): qty 1

## 2018-02-10 MED ORDER — BENZOCAINE-MENTHOL 20-0.5 % EX AERO
1.0000 "application " | INHALATION_SPRAY | CUTANEOUS | Status: DC | PRN
  Administered 2018-02-10: 1 via TOPICAL
  Filled 2018-02-10 (×2): qty 56

## 2018-02-10 MED ORDER — OXYTOCIN 40 UNITS IN LACTATED RINGERS INFUSION - SIMPLE MED
2.5000 [IU]/h | INTRAVENOUS | Status: DC
  Administered 2018-02-10: 2.5 [IU]/h via INTRAVENOUS
  Filled 2018-02-10: qty 1000

## 2018-02-10 MED ORDER — OXYTOCIN BOLUS FROM INFUSION
500.0000 mL | Freq: Once | INTRAVENOUS | Status: AC
  Administered 2018-02-10: 500 mL via INTRAVENOUS

## 2018-02-10 MED ORDER — SIMETHICONE 80 MG PO CHEW: 80.0000 mg | CHEWABLE_TABLET | ORAL | Status: DC | PRN

## 2018-02-10 MED ORDER — COCONUT OIL OIL
1.0000 "application " | TOPICAL_OIL | Status: DC | PRN
  Administered 2018-02-11: 1 via TOPICAL
  Filled 2018-02-10 (×2): qty 120

## 2018-02-10 MED ORDER — ZOLPIDEM TARTRATE 5 MG PO TABS: 5.0000 mg | ORAL_TABLET | Freq: Every evening | ORAL | Status: DC | PRN

## 2018-02-10 MED ORDER — DIPHENHYDRAMINE HCL 25 MG PO CAPS: 25.0000 mg | ORAL_CAPSULE | Freq: Four times a day (QID) | ORAL | Status: DC | PRN

## 2018-02-10 MED ORDER — SOD CITRATE-CITRIC ACID 500-334 MG/5ML PO SOLN: 30.0000 mL | ORAL | Status: DC | PRN

## 2018-02-10 MED ORDER — ONDANSETRON HCL 4 MG/2ML IJ SOLN: 4.0000 mg | INTRAMUSCULAR | Status: DC | PRN

## 2018-02-10 MED ORDER — PHENYLEPHRINE 40 MCG/ML (10ML) SYRINGE FOR IV PUSH (FOR BLOOD PRESSURE SUPPORT): 80.0000 ug | PREFILLED_SYRINGE | INTRAVENOUS | Status: DC | PRN

## 2018-02-10 MED ORDER — FLEET ENEMA 7-19 GM/118ML RE ENEM: 1.0000 | ENEMA | RECTAL | Status: DC | PRN

## 2018-02-10 MED ORDER — PRENATAL MULTIVITAMIN CH
1.0000 | ORAL_TABLET | Freq: Every day | ORAL | Status: DC
  Administered 2018-02-11 – 2018-02-12 (×2): 1 via ORAL
  Filled 2018-02-10 (×2): qty 1

## 2018-02-10 MED ORDER — TETANUS-DIPHTH-ACELL PERTUSSIS 5-2.5-18.5 LF-MCG/0.5 IM SUSP
0.5000 mL | Freq: Once | INTRAMUSCULAR | Status: DC
  Filled 2018-02-10: qty 0.5

## 2018-02-10 MED ORDER — LIDOCAINE HCL (PF) 1 % IJ SOLN
INTRAMUSCULAR | Status: DC | PRN
  Administered 2018-02-10 (×2): 5 mL via EPIDURAL

## 2018-02-10 MED ORDER — PHENYLEPHRINE 40 MCG/ML (10ML) SYRINGE FOR IV PUSH (FOR BLOOD PRESSURE SUPPORT)
80.0000 ug | PREFILLED_SYRINGE | INTRAVENOUS | Status: DC | PRN
  Filled 2018-02-10: qty 10

## 2018-02-10 MED ORDER — OXYCODONE-ACETAMINOPHEN 5-325 MG PO TABS: 2.0000 | ORAL_TABLET | ORAL | Status: DC | PRN

## 2018-02-10 MED ORDER — DIBUCAINE 1 % RE OINT
1.0000 "application " | TOPICAL_OINTMENT | RECTAL | Status: DC | PRN
  Filled 2018-02-10: qty 28

## 2018-02-10 NOTE — MAU Note (Signed)
Reports ctx since yesterday that have been getting more frequent and intense. States every 7 min  Some bloody show, no LOF, + FM   Last SVE was 1 cm

## 2018-02-10 NOTE — Anesthesia Preprocedure Evaluation (Signed)

## 2018-02-10 NOTE — Lactation Note (Signed)
This note was copied from a baby's chart. Lactation Consultation Note  Patient Name: Kari Sandoval [id number] Date: 02/10/2018 Reason for consult: Initial assessment;Early term 61-38.6wks  3 hours old early term female who is being exclusively BF by his mother, she's a P3 and experienced BF. Mom didn't BF her first child, but she did so with her second one for 4 months without any BF difficulties other than engorgement in the first week. She participated in the [facility] program at the [facility] and has a Medela DEBP at home. When revising hand expression with mom noticed that her nipples look inverted but they evert upon stimulation/compressions. Mom able to easily get colostrum out of her left breast.   Offered assistance with latch but mom politely declined; baby was asleep and swaddled in visitor's arms. Asked mom to call for assistance when needed. She did request a DEBP though, mom voiced again her episode of engorgement after leaving the hospital with her last baby and she wanted to be proactive and start pumping early on this time. LC set mom up with a DEBP, pump instructions, cleaning and storage were reviewed, as well as milk storage guidelines. Cluster feeding was discussed.  Feeding plan:  1. Encouraged mom to feed baby 8-12 times/24 hours or sooner if feeding cues are present. If baby is not cueing in a 3 hour period, mom will place him STS to the breast to give him an opportunity to feed 2. Mom will pump every 3 hours, or as needed, about 4-6 pumping sessions/24 hours period and finger/spoon baby any amount of EBM she may get  BF brochure, BF resources and feeding diary were reviewed. Mom reported all questions and concerns were answered, she's aware of LC services and will call PRN.  Maternal Data Formula Feeding for Exclusion: No Has patient been taught Hand Expression?: Yes Does the patient have breastfeeding experience prior to this delivery?: Yes  Feeding Feeding Type: Breast  Fed  LATCH Score Latch: Repeated attempts needed to sustain latch, nipple held in mouth throughout feeding, stimulation needed to elicit sucking reflex.  Audible Swallowing: A few with stimulation  Type of Nipple: Everted at rest and after stimulation  Comfort (Breast/Nipple): Soft / non-tender  Hold (Positioning): Assistance needed to correctly position infant at breast and maintain latch.  LATCH Score: 7  Interventions Interventions: Breast feeding basics reviewed;Hand express;Breast compression;DEBP;Breast massage  Lactation Tools Discussed/Used Tools: Pump Breast pump type: Double-Electric Breast Pump WIC Program: Yes Pump Review: Setup, frequency, and cleaning;Milk Storage Initiated by:: MPeck Date initiated:: 02/10/18   Consult Status Consult Status: Follow-up Date: 02/11/18 Follow-up type: In-patient    Kari Sandoval [name] 02/10/2018, 2:40 PM

## 2018-02-10 NOTE — Anesthesia Procedure Notes (Signed)
Epidural Patient location during procedure: OB  Staffing Anesthesiologist: Tenleigh Byer, MD Performed: anesthesiologist   Preanesthetic Checklist Completed: patient identified, site marked, surgical consent, pre-op evaluation, timeout performed, IV checked, risks and benefits discussed and monitors and equipment checked  Epidural Patient position: sitting Prep: DuraPrep Patient monitoring: heart rate, continuous pulse ox and blood pressure Approach: right paramedian Location: L3-L4 Injection technique: LOR saline  Needle:  Needle type: Tuohy  Needle gauge: 17 G Needle length: 9 cm and 9 Needle insertion depth: 7 cm Catheter type: closed end flexible Catheter size: 20 Guage Catheter at skin depth: 11 cm Test dose: negative  Assessment Events: blood not aspirated, injection not painful, no injection resistance, negative IV test and no paresthesia  Additional Notes Patient identified. Risks/Benefits/Options discussed with patient including but not limited to bleeding, infection, nerve damage, paralysis, failed block, incomplete pain control, headache, blood pressure changes, nausea, vomiting, reactions to medication both or allergic, itching and postpartum back pain. Confirmed with bedside nurse the patient's most recent platelet count. Confirmed with patient that they are not currently taking any anticoagulation, have any bleeding history or any family history of bleeding disorders. Patient expressed understanding and wished to proceed. All questions were answered. Sterile technique was used throughout the entire procedure. Please see nursing notes for vital signs. Test dose was given through epidural needle and negative prior to continuing to dose epidural or start infusion. Warning signs of high block given to the patient including shortness of breath, tingling/numbness in hands, complete motor block, or any concerning symptoms with instructions to call for help. Patient was given  instructions on fall risk and not to get out of bed. All questions and concerns addressed with instructions to call with any issues.     

## 2018-02-10 NOTE — Anesthesia Postprocedure Evaluation (Signed)
Anesthesia Post Note  Patient: Kari Sandoval  Procedure(s) Performed: AN AD HOC LABOR EPIDURAL     Patient location during evaluation: Mother Baby Anesthesia Type: Epidural Level of consciousness: awake, awake and alert and oriented Pain management: pain level controlled Vital Signs Assessment: post-procedure vital signs reviewed and stable Respiratory status: spontaneous breathing, nonlabored ventilation and respiratory function stable Cardiovascular status: stable Postop Assessment: no headache, no backache, no apparent nausea or vomiting, adequate PO intake, able to ambulate and patient able to bend at knees Anesthetic complications: no    Last Vitals:  Vitals:   02/10/18 1315 02/10/18 1425  BP: 115/69 117/71  Pulse: 87 79  Resp: 18 16  Temp: 36.9 C 36.9 C    Last Pain:  Vitals:   02/10/18 1425  TempSrc: Oral  PainSc: 4    Pain Goal:                 Deneise Getty

## 2018-02-10 NOTE — H&P (Addendum)
OBSTETRIC ADMISSION HISTORY AND PHYSICAL  Kari Sandoval is a 27 y.o. female [id number] with IUP at 100w5d by 1st trimester [place] presenting for SOL.   Reports fetal movement. Denies vaginal bleeding. Denies LOF. Having contractions every 3-5 minutes.   She received her prenatal care at [facility].  Support person in labor: Mother  Ultrasounds . Anatomy U/S: normal   Prenatal History/Complications: . None  Past Medical History: Past Medical History:  Diagnosis Date  . Asthma    Past Surgical History: Past Surgical History:  Procedure Laterality Date  . WISDOM TOOTH EXTRACTION     Obstetrical History: OB History    Gravida  5   Para  2   Term  2   Preterm  0   AB  2   Living  2     SAB  1   TAB  1   Ectopic      Multiple      Live Births           Obstetric Comments  SAB in 2017 at ~4w Elective AB at age 18 yo       Social History: Social History   Socioeconomic History  . Marital status: Single    Spouse name: Not on file  . Number of children: Not on file  . Years of education: Not on file  . Highest education level: Not on file  Occupational History  . Not on file  Social Needs  . Financial resource strain: Not on file  . Food insecurity:    Worry: Not on file    Inability: Not on file  . Transportation needs:    Medical: Not on file    Non-medical: Not on file  Tobacco Use  . Smoking status: Never Smoker  . Smokeless tobacco: Never Used  Substance and Sexual Activity  . Alcohol use: Not Currently    Comment: 1-2 times per week  . Drug use: No  . Sexual activity: Yes    Birth control/protection: None  Lifestyle  . Physical activity:    Days per week: Not on file    Minutes per session: Not on file  . Stress: Not on file  Relationships  . Social connections:    Talks on phone: Not on file    Gets together: Not on file    Attends religious service: Not on file    Active member of club or organization: Not on file    Attends meetings of  clubs or organizations: Not on file    Relationship status: Not on file  Other Topics Concern  . Not on file  Social History Narrative  . Not on file   Family History: Family History  Problem Relation Age of Onset  . Diabetes Father   . Diabetes Sister   . Diabetes Brother    Allergies: No Known Allergies  Medications Prior to Admission  Medication Sig Dispense Refill Last Dose  . ferrous sulfate 325 (65 FE) MG tablet Take 1 tablet (325 mg total) by mouth daily with breakfast. 90 tablet 2 02/09/2018 at Unknown time  . Prenatal Vit-Fe Fumarate-FA (PRENATAL MULTIVITAMIN) TABS tablet Take 1 tablet by mouth daily at 12 noon.   02/09/2018 at Unknown time  . zolpidem (AMBIEN) 5 MG tablet Take 1 tablet (5 mg total) by mouth at bedtime as needed for sleep. 5 tablet 0 Past Month at Unknown time  . albuterol (PROVENTIL HFA;VENTOLIN HFA) 108 (90 Base) MCG/ACT inhaler Inhale 2 puffs into the lungs  every 4 (four) hours as needed for wheezing or shortness of breath. 1 Inhaler 1 Unknown at Unknown time  . cephALEXin (KEFLEX) 500 MG capsule Take 1 capsule (500 mg total) by mouth 3 (three) times daily. 21 capsule 0 Unknown at Unknown time  . docusate sodium (COLACE) 100 MG capsule Take 1 capsule (100 mg total) by mouth 2 (two) times daily as needed for mild constipation. 10 capsule 0 Unknown at Unknown time  . Elastic Bandages & Supports (COMFORT FIT MATERNITY SUPP MED) MISC 1 Device by Does not apply route daily. 1 each 0 Unknown at Unknown time  . Elastic Bandages & Supports (WRIST BRACE/LEFT MEDIUM) MISC 1 Device by Does not apply route daily. 1 each 0 Unknown at Unknown time  . Elastic Bandages & Supports (WRIST BRACE/RIGHT MEDIUM) MISC 1 Device by Does not apply route at bedtime. 1 each 0 Unknown at Unknown time  . ENSURE (ENSURE) Take 237 mLs by mouth 2 (two) times daily between meals. 237 mL 12 Unknown at Unknown time  . Misc. Devices (WRIST BRACE) MISC 1 Device by Does not apply route daily. 1 each  0 Unknown at Unknown time   Review of Systems  All systems reviewed and negative except as stated in HPI  Blood pressure 121/76, pulse 92, temperature 98.3 F (36.8 C), temperature source Oral, resp. rate 20, weight 83.9 kg, last menstrual period 05/22/2017. General appearance: alert, cooperative and mild distress Lungs: no respiratory distress Heart: regular rate  Abdomen: soft, non-tender; gravid b Presentation: cephalic Fetal monitoring: Cat 1 strip Uterine activity: contractions every 2-3 minutes  Dilation: 5 Effacement (%): 90 Station: -3 Exam by:: Elonda Husky RN  Prenatal labs: ABO, Rh: B/Negative/-- (03/26 0950) Antibody: Positive, See Final Results (10/24 1207) Rubella: 22.80 (03/26 0950) RPR: Non Reactive (10/08 1453)  HBsAg: Negative (03/26 0950)  HIV: Non Reactive (10/08 1453)  GBS:   negative Glucola: normal  Genetic screening:  none  Prenatal Transfer Tool  Maternal Diabetes: No Genetic Screening: Declined Maternal Ultrasounds/Referrals: Normal Fetal Ultrasounds or other Referrals:  None Maternal Substance Abuse:  No Significant Maternal Medications:  None Significant Maternal Lab Results: None  No results found for this or any previous visit (from the past 24 hour(s)).  Patient Active Problem List   Diagnosis Date Noted  . Abnormal Pap smear of cervix 01/01/2018  . Encounter for supervision of low-risk pregnancy 09/19/2017  . Encounter for weight management 04/16/2017  . Menorrhagia 04/16/2017  . Pre-op evaluation 07/13/2016    Assessment/Plan:  Kari Sandoval is a 27 y.o. [id number] at [redacted]w[redacted]d here for SOL.  Labor: patient admitted to L&D and was determined to be complete shortly after arrival -- pain control: epidural placed prior to delivery   Fetal Wellbeing: Cephalic by exam  -- GBS negative  -- continuous fetal monitoring - Cat 1 tracing   Postpartum Planning -- breast/Mirena -- R negative/ noTdap   Burman Nieves, MD Family Medicine  Resident     RESIDENT ADDENDUM I have separately seen and examined the patient. I have discussed the findings and exam with the medical student and agree with the above note. I helped develop the management plan that is described in the student's note, and I agree with the content.   Clayton Bibles, CNM 02/10/18  12:17 PM

## 2018-02-11 LAB — RPR: RPR: NONREACTIVE

## 2018-02-11 MED ORDER — RHO D IMMUNE GLOBULIN 1500 UNIT/2ML IJ SOSY
300.0000 ug | PREFILLED_SYRINGE | Freq: Once | INTRAMUSCULAR | Status: AC
  Administered 2018-02-11: 300 ug via INTRAVENOUS
  Filled 2018-02-11: qty 2

## 2018-02-11 MED ORDER — SODIUM CHLORIDE 0.9% FLUSH
3.0000 mL | INTRAVENOUS | Status: DC | PRN
  Administered 2018-02-11: 3 mL via INTRAVENOUS

## 2018-02-11 NOTE — Progress Notes (Addendum)
POSTPARTUM PROGRESS NOTE  Post Partum Day 1  Subjective:  Kari Sandoval is a 27 y.o. [id number] s/p SVD at [redacted]w[redacted]d admitted for SOL.  She reports she is doing well. No acute events overnight. She denies any problems with ambulating or po intake. No BM but passing gas. Denies nausea or vomiting.  Pain is well controlled.  Lochia is moderate but decreasing from prior.  Objective: Blood pressure 105/74, pulse 88, temperature 98.4 F (36.9 C), resp. rate 18, height 5\' 6"  (1.676 m), weight 83.9 kg, last menstrual period 05/22/2017, SpO2 99 %.  Physical Exam:  General: alert, cooperative and no distress Chest: no respiratory distress Heart:regular rate, distal pulses intact Abdomen: soft, nontender,  Uterine Fundus: firm, appropriately tender DVT Evaluation: No calf swelling or tenderness Extremities: no LE edema Skin: warm, dry  Recent Labs    02/10/18 0911  HGB 11.3*  HCT 32.8*    Assessment/Plan: Kari Sandoval is a 27 y.o. A5W0981 s/p SVD at [redacted]w[redacted]d admitted for SOL  PPD#1 - Doing well  Routine postpartum care Contraception: IUD Feeding: Breast Circ: OP Dispo: Plan for discharge tomorrow.   LOS: 1 day   Milbert Coulter MS3 02/11/2018, 7:14 AM   See separate note  I confirm that I have verified the information documented in the medical student's note's note and that I have also personally performed the physical exam and all medical decision making activities.   Clayton Bibles, CNM 02/11/18  4:27 PM

## 2018-02-11 NOTE — Progress Notes (Signed)
Post Partum Day 1, SVD 02/10/18 @ 1110 Subjective: no complaints, up ad lib, voiding, tolerating PO and + flatus  Objective: Blood pressure 105/74, pulse 88, temperature 98.4 F (36.9 C), resp. rate 18, height 5\' 6"  (1.676 m), weight 83.9 kg, last menstrual period 05/22/2017, SpO2 99 %.  Physical Exam:  General: alert, cooperative, appears stated age and no distress Lochia: appropriate Uterine Fundus: firm Incision: N/A DVT Evaluation: No evidence of DVT seen on physical exam.  Recent Labs    02/10/18 0911  HGB 11.3*  HCT 32.8*    Assessment/Plan: Plan for discharge tomorrow   LOS: 1 day   Kari Sandoval, CNM 02/11/2018, 10:06 AM

## 2018-02-11 NOTE — Lactation Note (Signed)
This note was copied from a baby's chart. Lactation Consultation Note  Patient Name: Kari Sandoval [id number] Date: 02/11/2018 Reason for consult: Follow-up assessment;Early term 14-38.6wks  P3 mother whose infant is now 61 hours old.  Mother has prior breast feeding experience with her second child who is now 29 years old.  Mother was breast feeding using the cradle position when I arrived.  Baby had wide mouth with flanged lips and rhythmic sucking noted.  Audible swallows were heard.  Mother denied pain with latching.    Encouraged to feed 8-12 times/24 hours or sooner if baby shows feeding cues.  Suggested mother do hand expression before/after feedings to help increase milk supply.  Colostrum container provided for any EBM she may obtain with hand expression.  Milk storage times reviewed.  Mother had requested the DEBP for personal use last night and LC set this up.  She had no questions regarding pumping.  She will call for latch assistance as needed.     Maternal Data Formula Feeding for Exclusion: No Has patient been taught Hand Expression?: Yes Does the patient have breastfeeding experience prior to this delivery?: Yes  Feeding Feeding Type: Breast Fed  LATCH Score Latch: Grasps breast easily, tongue down, lips flanged, rhythmical sucking.  Audible Swallowing: A few with stimulation  Type of Nipple: Everted at rest and after stimulation  Comfort (Breast/Nipple): Soft / non-tender  Hold (Positioning): No assistance needed to correctly position infant at breast.  LATCH Score: 9  Interventions Interventions: Breast feeding basics reviewed;Skin to skin;Breast massage;Hand express;Breast compression  Lactation Tools Discussed/Used WIC Program: No Initiated by:: Already initiated   Consult Status Consult Status: Follow-up Date: 02/12/18 Follow-up type: In-patient    Betzy Barbier R Hailly Fess 02/11/2018, 12:40 PM

## 2018-02-12 ENCOUNTER — Encounter (HOSPITAL_COMMUNITY): Payer: Self-pay | Admitting: *Deleted

## 2018-02-12 ENCOUNTER — Encounter: Payer: Medicaid Other | Admitting: Family Medicine

## 2018-02-12 LAB — RH IG WORKUP (INCLUDES ABO/RH)
ABO/RH(D): B NEG
Fetal Screen: NEGATIVE
Gestational Age(Wks): 37.5
UNIT DIVISION: 0

## 2018-02-12 MED ORDER — BREAST MILK
ORAL | Status: DC
  Filled 2018-02-12: qty 1

## 2018-02-12 MED ORDER — ACETAMINOPHEN 325 MG PO TABS: 650.0000 mg | ORAL_TABLET | ORAL | 0 refills | Status: AC | PRN

## 2018-02-12 MED ORDER — IBUPROFEN 600 MG PO TABS: 600.0000 mg | ORAL_TABLET | Freq: Four times a day (QID) | ORAL | 0 refills | Status: AC

## 2018-02-12 NOTE — Discharge Summary (Addendum)
Obstetrics Discharge Summary OB/GYN Faculty Practice   Patient Name: Kari Sandoval DOB: 22-Mar-1991 MRN: [id number]  Date of admission: 02/10/2018 Delivering MD: Burman Nieves A   Date of discharge: 02/12/2018  Admitting diagnosis: 38 WKS CTX Intrauterine pregnancy: [redacted]w[redacted]d     Secondary diagnosis:   Active Problems:   Normal labor  Additional problems: none     Discharge diagnosis: Term Pregnancy Delivered                                            Postpartum procedures: None  Complications: None  Hospital course: Kari Sandoval is a 27 y.o. [redacted]w[redacted]d who was admitted for SOL. Her pregnancy was uncomplicated. Her labor course was notable for AROM. Delivery was uncomplicated. Please see delivery/op note for additional details. Her postpartum course was uncomplicated. She was breastfeeding without difficulty. By day of discharge, she was passing flatus, urinating, eating and drinking without difficulty. Her pain was well-controlled, and she was discharged home with ibuprofen. She will follow-up in clinic in 1 weeks.   Physical exam  Vitals:   02/11/18 1356 02/11/18 1357 02/11/18 2204 02/12/18 0635  BP: (!) 108/57  104/70 111/71  Pulse: 79  75 64  Resp: 18  19 16   Temp:  98 F (36.7 C) 98.1 F (36.7 C) 98.5 F (36.9 C)  TempSrc:  Oral Oral Oral  SpO2: 100%   100%  Weight:      Height:       General: well appearing, no distress Lochia: appropriate Uterine Fundus: firm, below the level of the umbilicus Incision: N/A DVT Evaluation: No evidence of DVT seen on physical exam. Negative Homan's sign. Labs: Lab Results  Component Value Date   WBC 6.9 02/10/2018   HGB 11.3 (L) 02/10/2018   HCT 32.8 (L) 02/10/2018   MCV 88.9 02/10/2018   PLT 147 (L) 02/10/2018   CMP Latest Ref Rng & Units 06/15/2017  Glucose 65 - 99 mg/dL 77  BUN 6 - 20 mg/dL 12  Creatinine 6.64 - 4.03 mg/dL 4.74  Sodium 259 - 563 mmol/L 139  Potassium 3.5 - 5.2 mmol/L 4.0  Chloride 96 - 106 mmol/L 103  CO2 20 -  29 mmol/L 20  Calcium 8.7 - 10.2 mg/dL 9.5  Total Protein 6.0 - 8.5 g/dL 7.3  Total Bilirubin 0.0 - 1.2 mg/dL 0.4  Alkaline Phos 39 - 117 IU/L 67  AST 0 - 40 IU/L 19  ALT 0 - 32 IU/L 14    Discharge instructions: Per After Visit Summary and "Baby and Me Booklet"  After visit meds:  Allergies as of 02/12/2018   No Known Allergies     Medication List    STOP taking these medications   ENSURE   prenatal multivitamin Tabs tablet     TAKE these medications   acetaminophen 325 MG tablet Commonly known as:  TYLENOL Take 2 tablets (650 mg total) by mouth every 4 (four) hours as needed (for pain scale < 4).   albuterol 108 (90 Base) MCG/ACT inhaler Commonly known as:  PROVENTIL HFA;VENTOLIN HFA Inhale 2 puffs into the lungs every 4 (four) hours as needed for wheezing or shortness of breath.   docusate sodium 100 MG capsule Commonly known as:  COLACE Take 1 capsule (100 mg total) by mouth 2 (two) times daily as needed for mild constipation.   ferrous sulfate 325 (65 FE) MG tablet  Take 1 tablet (325 mg total) by mouth daily with breakfast.   ibuprofen 600 MG tablet Commonly known as:  ADVIL,MOTRIN Take 1 tablet (600 mg total) by mouth every 6 (six) hours.       Postpartum contraception: IUD Diet: Routine Diet Activity: Advance as tolerated. Pelvic rest for 6 weeks.   Outpatient follow up:1 week Follow-up Appt: Future Appointments  Date Time Provider Department Center  02/12/2018  3:30 PM Garth Bigness, MD [facility]  02/21/2018 11:00 AM [facility]   Newborn Data: Live born female  Birth Weight: 6 lb 8.4 oz (2960 g) APGAR: 9, 10  Newborn Delivery   Birth date/time:  02/10/2018 11:10:00 Delivery type:  Vaginal, Spontaneous     Baby Feeding: Bottle and Breast Disposition:home with mother  Burman Nieves, MD Family Medicine Resident   OB FELLOW DISCHARGE ATTESTATION  I have seen and examined this patient and agree with above  documentation in the resident's note.   Marcy Siren, D.O. OB Fellow  02/13/2018, 10:55 AM

## 2018-02-12 NOTE — Discharge Instructions (Signed)

## 2018-02-12 NOTE — Lactation Note (Signed)
This note was copied from a baby's chart. Lactation Consultation Note  Patient Name: Kari Sandoval [id number] Date: 02/12/2018 Reason for consult: Follow-up assessment;Early term 37-38.6wks;Infant weight loss(8% weight loss )  Baby is 4 hours old.  As LC entered the room , mom holding baby and mentioned she had recently fed the baby at  1147 10 ml and he last breast fed at 1043 for 17 mins. During consult ha d yellowish Eickhoff stool. Per mom her milk is in and earlier today was able to pump off 60 ml an then 30 ml.  Mom denies sore nipples , just some over fullness, and knows to use the DEBP to release Breast. Sore nipple and engorgement prevention and tx reviewed.  Per mom has DEBP / Medela at home.  LC  Reviewed potential feeding behaviors with Early term infants and with weight loss.  LC recommended feeding the 1st breast for 15 -20 mins, supplement with 30 ml of EBM ( PACE feeding ) and then post pump both breast for 10 -15 mins , save milk for next feeding.  Importance of STS feeding until the baby can stay awake for a feeding.  LC recommended feeding with feeding cues, but by 3 hours, need to wake the abby to feed.  If the baby is sluggish, give and appetizer of EBM 10 ml , then try to latch , if still sluggish, finish the 30 ml and see if the baby will latch, if not, post pump and try the next feeding.  LC reviewed nutritive vs non - nutritive feeding patterns, and to watch for hanging out latched.   Maternal Data Has patient been taught Hand Expression?: Yes(per mom feels comfortable )  Feeding Feeding Type: Breast Milk  LATCH Score ( Latch score by the [facility] RN )  Latch: Repeated attempts needed to sustain latch, nipple held in mouth throughout feeding, stimulation needed to elicit sucking reflex.  Audible Swallowing: Spontaneous and intermittent  Type of Nipple: Everted at rest and after stimulation  Comfort (Breast/Nipple): Filling, red/small blisters or bruises, mild/mod  discomfort  Hold (Positioning): Assistance needed to correctly position infant at breast and maintain latch.  LATCH Score: 7  Interventions Interventions: Breast feeding basics reviewed  Lactation Tools Discussed/Used     Consult Status Consult Status: Complete Date: 02/12/18    Kathrin Greathouse 02/12/2018, 12:21 PM

## 2018-02-14 LAB — TYPE AND SCREEN
ABO/RH(D): B NEG
Antibody Screen: POSITIVE
Unit division: 0
Unit division: 0

## 2018-02-14 LAB — BPAM RBC
BLOOD PRODUCT EXPIRATION DATE: 201911262359
Blood Product Expiration Date: 201911252359
ISSUE DATE / TIME: 201911051347
UNIT TYPE AND RH: 1700
UNIT TYPE AND RH: 1700

## 2018-02-21 ENCOUNTER — Telehealth: Payer: Self-pay | Admitting: Family Medicine

## 2018-02-21 NOTE — Telephone Encounter (Signed)
Pt is calling and would like to know if Dr. Chanetta Marshallimberlake can write a note for her school/financial aid. She would like a note stating the issues she has had with being sick and weak during her pregnancy. She needs this as an excuse for not completing some of her assignments and classes online. She would like to have this note left up front to be picked up. Please call pt when it is ready.

## 2018-02-25 NOTE — Telephone Encounter (Signed)
Note written and will place up front.

## 2018-03-04 ENCOUNTER — Telehealth: Payer: Self-pay | Admitting: Family Medicine

## 2018-03-04 NOTE — Telephone Encounter (Signed)
FMLA form dropped off for at front desk for completion.  Verified that patient section of form has been completed.  Last DOS/WCC with PCP was 01/15/18.  Placed form in white team folder to be completed by clinical staff.  Lina Sarheryl A Stanley

## 2018-03-04 NOTE — Telephone Encounter (Signed)
Clinical info completed on FMLA form.  Place form in Dr. Timberlake's box for completion.  Zimmerman Rumple,  D, CMA  

## 2018-03-05 NOTE — Telephone Encounter (Signed)
Can you please call patient and clarify? Does she need FMLA paper work again? I filled out paperwork for her during pregnancy I believe. If she needs it filled out again, I need the specific date that she will be returning to work to put on her form. And if there is a specific date that she went out (if before delivery), I need that as well.

## 2018-03-06 NOTE — Telephone Encounter (Signed)
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  I checked and it looks like the previous form was scanned into her chart if you need to check the dates for the paper work that her HR department is requesting for their dept. Lamonte SakaiZimmerman Rumple, Holden Draughon D, [place]

## 2018-03-13 ENCOUNTER — Ambulatory Visit (INDEPENDENT_AMBULATORY_CARE_PROVIDER_SITE_OTHER): Payer: Medicaid Other | Admitting: Family Medicine

## 2018-03-13 ENCOUNTER — Other Ambulatory Visit: Payer: Self-pay

## 2018-03-13 ENCOUNTER — Encounter: Payer: Self-pay | Admitting: Family Medicine

## 2018-03-13 VITALS — BP 106/66 | HR 85 | Temp 98.2°F | Ht 67.0 in | Wt 164.6 lb

## 2018-03-13 DIAGNOSIS — Z3009 Encounter for other general counseling and advice on contraception: Secondary | ICD-10-CM | POA: Diagnosis not present

## 2018-03-13 DIAGNOSIS — Z3202 Encounter for pregnancy test, result negative: Secondary | ICD-10-CM

## 2018-03-13 DIAGNOSIS — Z30430 Encounter for insertion of intrauterine contraceptive device: Secondary | ICD-10-CM

## 2018-03-13 LAB — POCT URINE PREGNANCY: PREG TEST UR: NEGATIVE

## 2018-03-13 NOTE — Telephone Encounter (Signed)
Copy placed in batch scanning, and also in to be faxed pile to be faxed to to [phone] as well as original placed at front to be picked up.  Pt informed. Fleeger, Kari RochesterJessica Dawn, CMA

## 2018-03-13 NOTE — Patient Instructions (Signed)
See you in 4 weeks to recheck your IUD. We will fax the FMLA form. Please call if you feel like the postpartum mood changes are coming back. Please let me know if you need help with pumping accomodations.

## 2018-03-13 NOTE — Progress Notes (Signed)
   CC: postpartum check   HPI  Contraception - wants IUD, hx of nuvaring, but would like the longer term of IUD. No sexual activity yet.   Breastfeeding - going well, no pain. Pumping every 3 hours or so to if she is giving bottles. She is giving bottles mostly to try to get baby ready for daycare in 3 weeks. Has large freezer supply already. Is able to have 2 20min pump breaks + lunch break at work.   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 Her mom is visiting frequently to help w baby care, chores, and check on her mood. She feels no concerns with mood right now, she will reach out to me if this changes.   Edinburgh Postnatal Depression Scale - 03/13/18 1218      Edinburgh Postnatal Depression Scale:  In the Past 7 Days   I have been able to laugh and see the funny side of things.  0    I have looked forward with enjoyment to things.  0    I have blamed myself unnecessarily when things went wrong.  0    I have been anxious or worried for no good reason.  0    I have felt scared or panicky for no good reason.  0    Things have been getting on top of me.  0    I have been so unhappy that I have had difficulty sleeping.  0    I have felt sad or miserable.  0    I have been so unhappy that I have been crying.  0    The thought of harming myself has occurred to me.  0    Edinburgh Postnatal Depression Scale Total  0        ROS: Denies CP, SOB, abdominal pain, dysuria, changes in BMs.   CC, SH/smoking status, and VS noted  Objective: BP 106/66   Pulse 85   Temp 98.2 F (36.8 C) (Oral)   Ht 5\' 7"  (1.702 m)   Wt 164 lb 9.6 oz (74.7 kg)   SpO2 97%   BMI 25.78 kg/m  Gen: NAD, alert, cooperative, and pleasant. HEENT: NCAT, EOMI, PERRL GU: normal external female genitalia, cervix easily visualized, no cervical or adnexal tenderness.  Ext: No edema, warm Neuro: Alert and oriented, Speech clear, No gross deficits   IUD Insertion Procedure  Note Patient identified, informed consent performed.  Discussed risks of irregular bleeding, cramping, infection, malpositioning or misplacement of the IUD outside the uterus which may require further procedures. Time out was performed.  Urine pregnancy test negative.  Speculum placed in the vagina.  Cervix visualized.  Cleaned with Betadine x 2.  Grasped anteriorly with a single tooth tenaculum.  Uterus sounded to 8 cm.  Mirena IUD placed per manufacturer's recommendations.  Strings trimmed to 4 cm. Tenaculum was removed, good hemostasis noted.  Patient tolerated procedure well.   Patient was given post-procedure instructions.  Patient was asked to follow up in 4 weeks for IUD check.  Mood - continue to monitor.   Breastfeeding- encouraged continued breastfeeding, she will reach out if she needs a letter explaining ways her workplace can support this.   Loni MuseKate Timberlake, MD, PGY3 03/13/2018 11:10 AM

## 2018-03-14 MED ORDER — LEVONORGESTREL 20 MCG/24HR IU IUD
INTRAUTERINE_SYSTEM | Freq: Once | INTRAUTERINE | Status: AC
  Administered 2018-03-13: 1 via INTRAUTERINE

## 2018-03-14 NOTE — Addendum Note (Signed)
Addended by: Jone BasemanFLEEGER, JESSICA D on: 03/14/2018 12:24 PM   Modules accepted: Orders

## 2018-03-21 ENCOUNTER — Telehealth: Payer: Self-pay | Admitting: Family Medicine

## 2018-03-21 NOTE — Telephone Encounter (Signed)
Pt is calling because she needs a more detailed letter explaining why she could not do her online school work. Please call her to get more details on what financial aide is needing. jw

## 2018-03-21 NOTE — Telephone Encounter (Signed)
I think April was going to call and ask patient what details the letter needed.

## 2018-03-25 NOTE — Telephone Encounter (Signed)
LVM for pt to call office so that we can find out what kind of detailed information she is needing to be in her letter for school.  If she calls back please get this information so we can get it to the doctor so she can complete the letter. Lamonte SakaiZimmerman Rumple, April D, [place]

## 2018-03-26 NOTE — Telephone Encounter (Signed)
Pt stopped by and the details she is needing in the letter are, Why should could not do her school work during her pregnancy. Such, being nausea, weight gain issues, and the all the other problems that she had during her pregnancy  jw

## 2018-03-27 NOTE — Telephone Encounter (Signed)
I re wrote the letter to that effect, will place it up front. Please let her know.

## 2018-03-27 NOTE — Telephone Encounter (Signed)
Left generic VM for a return call to our office. If pt calls, please let her know the letter will be up front for her to pick up. Sunday SpillersSharon T Saunders, CMA

## 2018-04-12 ENCOUNTER — Ambulatory Visit: Payer: Medicaid Other | Admitting: Family Medicine

## 2018-05-09 ENCOUNTER — Other Ambulatory Visit: Payer: Self-pay

## 2018-05-09 ENCOUNTER — Encounter: Payer: Self-pay | Admitting: Family Medicine

## 2018-05-09 ENCOUNTER — Telehealth: Payer: Self-pay | Admitting: Family Medicine

## 2018-05-09 ENCOUNTER — Ambulatory Visit: Payer: Medicaid Other | Admitting: Family Medicine

## 2018-05-09 VITALS — BP 104/62 | HR 74 | Temp 98.1°F | Ht 67.0 in | Wt 171.0 lb

## 2018-05-09 DIAGNOSIS — J069 Acute upper respiratory infection, unspecified: Secondary | ICD-10-CM

## 2018-05-09 DIAGNOSIS — J029 Acute pharyngitis, unspecified: Secondary | ICD-10-CM

## 2018-05-09 LAB — POCT INFLUENZA A/B
INFLUENZA A, POC: NEGATIVE
INFLUENZA B, POC: NEGATIVE

## 2018-05-09 LAB — POCT RAPID STREP A (OFFICE): Rapid Strep A Screen: NEGATIVE

## 2018-05-09 NOTE — Progress Notes (Signed)
Subjective: Chief Complaint  Patient presents with  . congestion, headache, vomiting and dirrhea    x 2 days     HPI: Kari Sandoval is a 28 y.o. presenting to clinic today to discuss the following:  1 Sore throat, congestion, diarrhea, vomiting Patient has been feeling ill for the past 2 days.  Vomited 4 times 2 days ago, twice yesterday, and none today.  Patient is currently breast feeding and attempting to stay hydrated.  Patient works at [employer].  States that people there were coughing a lot.  Has an occasionally productive cough with white-yellow sputum.  States that it sounds like her voice is slowly going away.  No CP, SOB.  Only abdominal pain on Tuesday.  Diarrhea has been improving.  States that she is going 2-3x a day.  No blood, Bristol Stool Type 3 at present.  Has been eating less because she has trouble keeping things down.  Was very weak on Tuesday and Wednesday.  Had some body aches, but now improved.  No fevers.  Has not taken anything for this because she has been breast feeding.  Patient also has a headache off and on.   ROS noted in HPI.   Past Medical, Surgical, Social, and Family History Reviewed & Updated per EMR.   Pertinent Historical Findings include:   Social History   Tobacco Use  Smoking Status Never Smoker  Smokeless Tobacco Never Used      Objective: BP 104/62   Pulse 74   Temp 98.1 F (36.7 C) (Oral)   Ht 5\' 7"  (1.702 m)   Wt 171 lb (77.6 kg)   SpO2 97%   BMI 26.78 kg/m  Vitals and nursing notes reviewed  Physical Exam: General: 28 y.o. female in NAD HEENT: NCAT, MMM, oropharynx injected, no tonsillar hypertrophy or exudate Neck: supple, full ROM, no cervical LAD Cardio: RRR no m/r/g Lungs: CTAB, no wheezing, no rhonchi, no crackles, no increased work of breathing on RA Abdomen: Soft, non-tender to palpation, positive bowel sounds Skin: warm and dry Extremities: No edema   Results for orders placed or performed in visit on  05/09/18 (from the past 72 hour(s))  Influenza A/B     Status: None   Collection Time: 05/09/18  2:50 PM  Result Value Ref Range   Influenza A, POC Negative Negative   Influenza B, POC Negative Negative  POCT rapid strep A     Status: None   Collection Time: 05/09/18  2:50 PM  Result Value Ref Range   Rapid Strep A Screen Negative Negative    Assessment/Plan:  Viral URI Patient with likely viral URI.  No evidence of pneumonia on exam.  Reassured as she is not having fevers and is improving since onset of symptoms.  Given continued sore throat and exposure to many children at work, will check for Strep and Flu.  Rapid strep and flu A&B negative. - can take mucinex prn for congestion - tylenol for headache     PATIENT EDUCATION PROVIDED: See AVS    Diagnosis and plan along with any newly prescribed medication(s) were discussed in detail with this patient today. The patient verbalized understanding and agreed with the plan. Patient advised if symptoms worsen return to clinic or ER.     Orders Placed This Encounter  Procedures  . Influenza A/B  . POCT rapid strep A    No orders of the defined types were placed in this encounter.    Luis Abed,  DO 05/09/2018, 3:14 PM PGY-1 [facility]

## 2018-05-09 NOTE — Patient Instructions (Addendum)
Thank you for coming to see me today. It was a pleasure. Today we talked about:   Your congestion: you likely have a virus that has caused this.  You can take mucinex as needed that is over the counter.    Please follow-up in 1 week if your symptoms don't resolve.   If you have any questions or concerns, please do not hesitate to call the office at [phone].  Best,   Luis Abed, DO

## 2018-05-09 NOTE — Telephone Encounter (Signed)
Called patient and informed her of negative test results.

## 2018-05-09 NOTE — Telephone Encounter (Signed)
Pt called and wanted to get the result of her test. Call the Pt at [phone]. ad

## 2018-05-09 NOTE — Assessment & Plan Note (Addendum)
Patient with likely viral URI.  No evidence of pneumonia on exam.  Reassured as she is not having fevers and is improving since onset of symptoms.  Given continued sore throat and exposure to many children at work, will check for Strep and Flu.  Rapid strep and flu A&B negative. - can take mucinex prn for congestion - tylenol for headache

## 2018-05-15 NOTE — Telephone Encounter (Signed)
finished

## 2018-08-19 IMAGING — US US MFM OB COMP +14 WKS
1 series · 14 of 28 positions shown · non-contrast
Comparison: none

[Series 1: us mfm ob comp +14 wks · 14 of 109 slices shown]
[im 5/109]
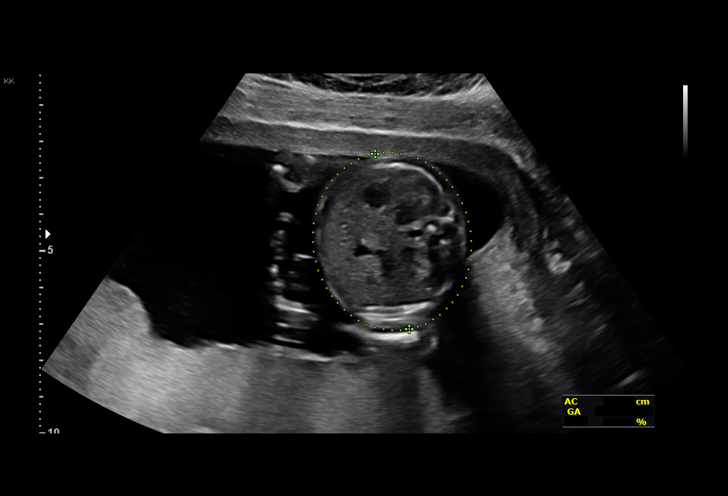
[im 13/109]
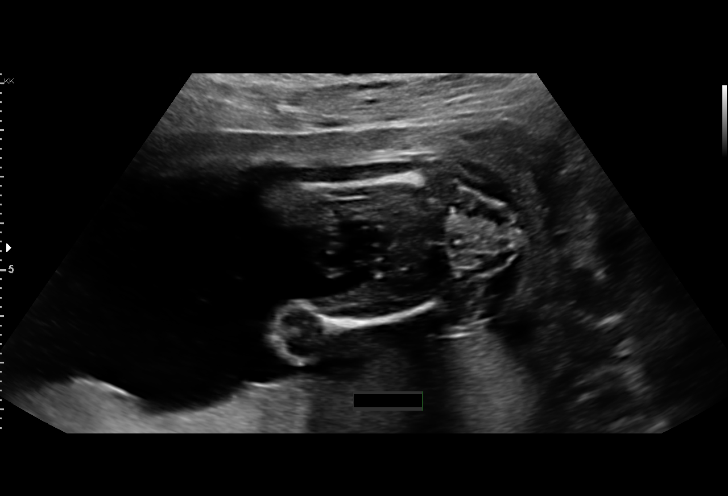
[im 21/109]
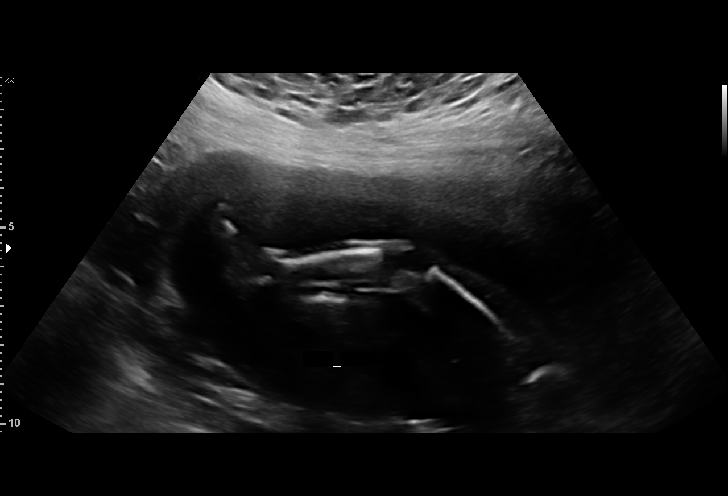
[im 29/109]
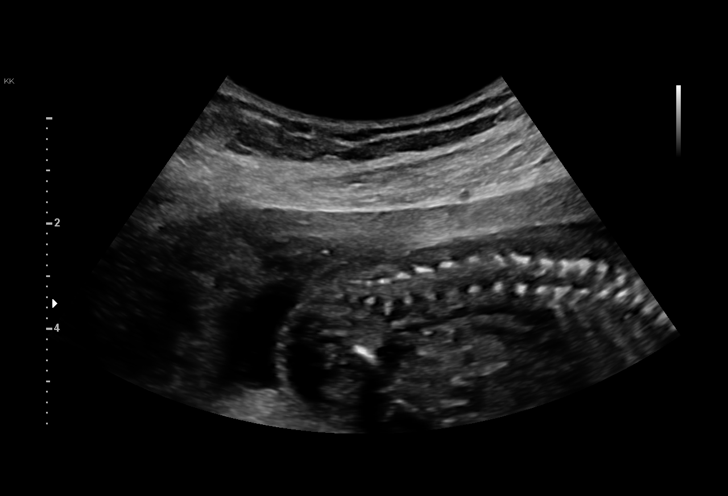
[im 37/109]
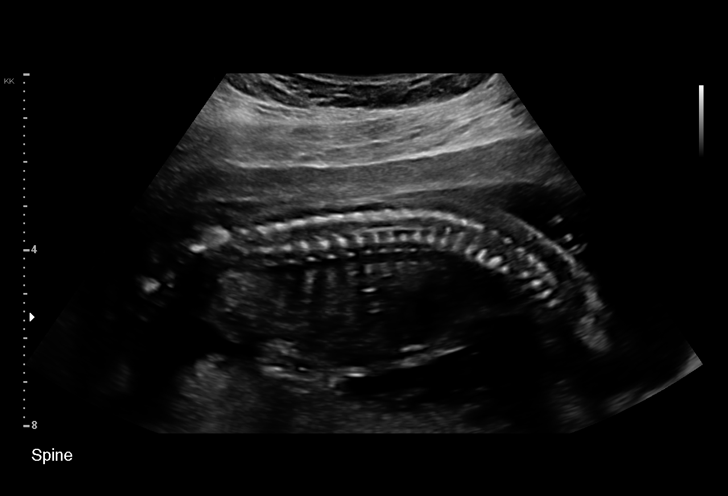
[im 45/109]
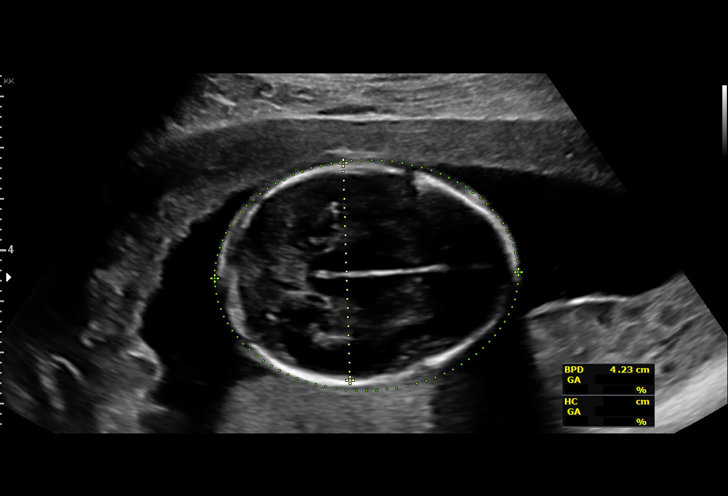
[im 53/109]
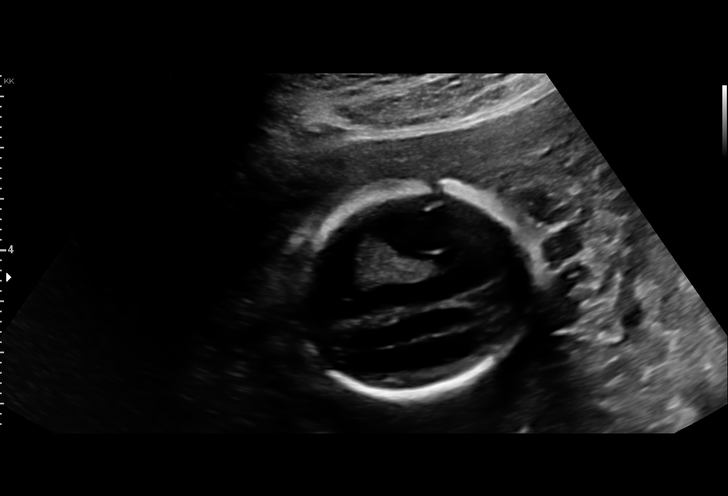
[im 61/109]
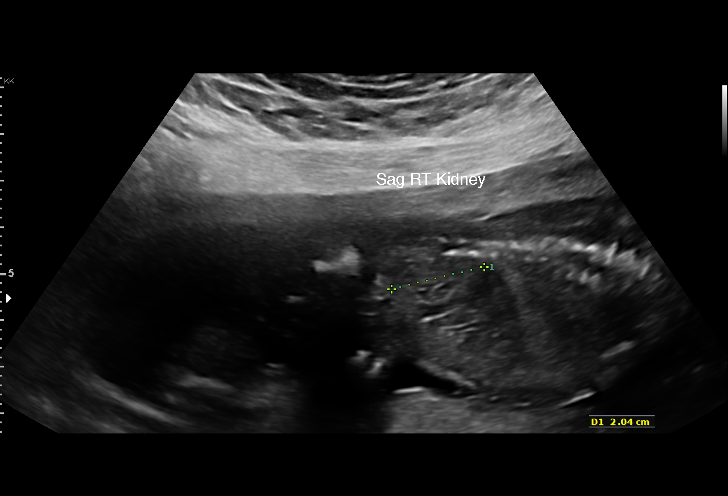
[im 69/109]
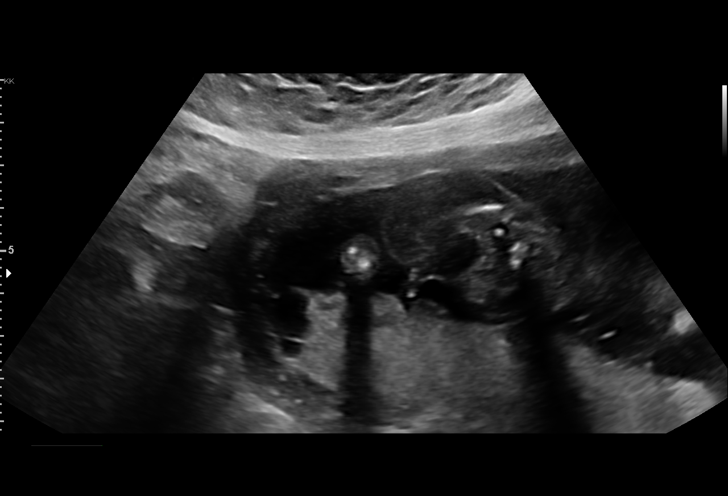
[im 77/109]
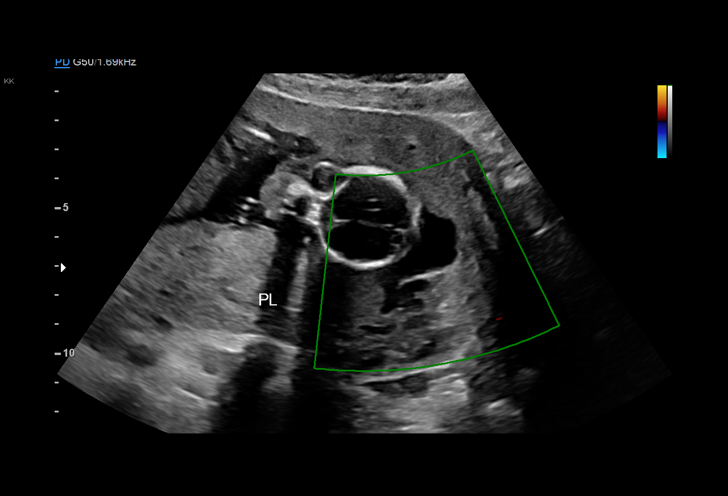
[im 85/109]
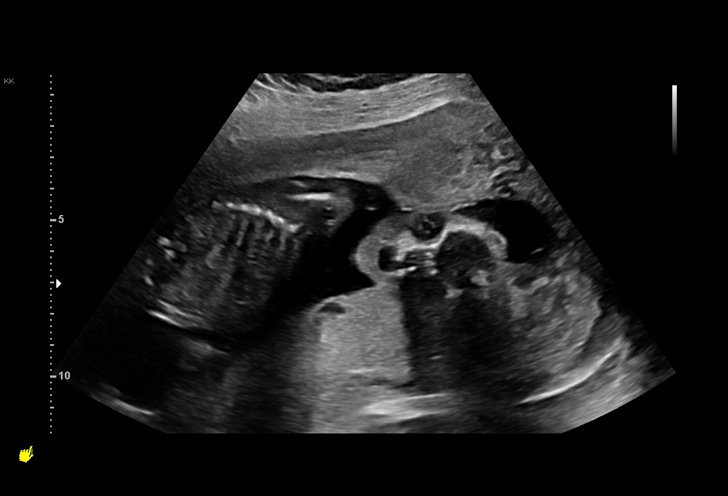
[im 93/109]
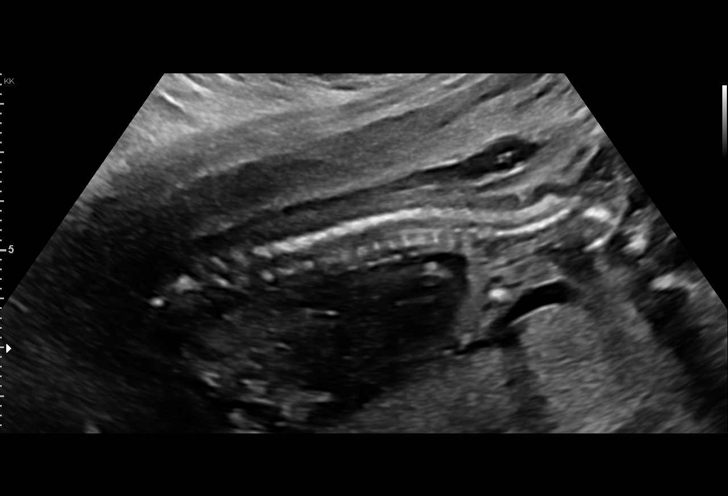
[im 101/109]
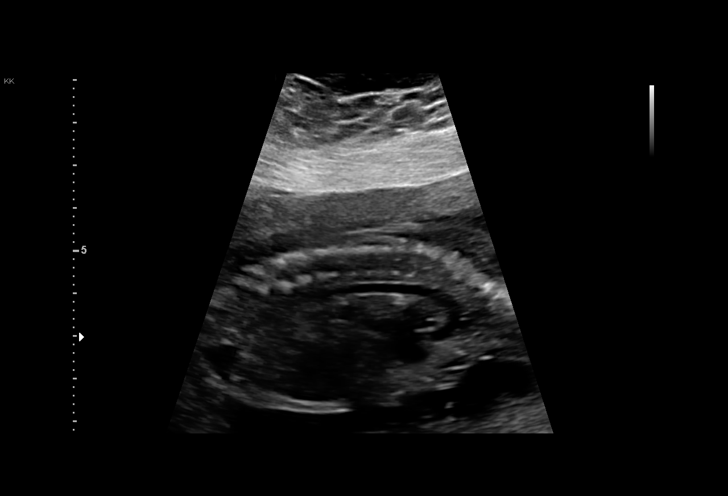
[im 109/109]
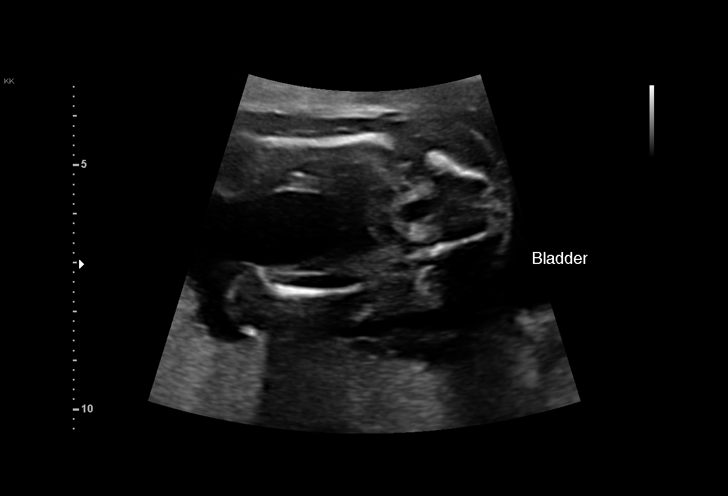

[14 of 28 positions shown; findings below may reference images not displayed]

1  DALYA BAREFOOT           [id number]      [id number]     [id number]
Indications

18 weeks gestation of pregnancy
Encounter for antenatal screening for
malformations
Asthma                                         ENN.XN j51.333
OB History

Blood Type:            Height:  5'6"   Weight (lb):  179       BMI:
Gravidity:    5         Term:   2        Prem:   0        SAB:   1
TOP:          1       Ectopic:  0        Living: 2
Fetal Evaluation

Num Of Fetuses:     1
Fetal Heart         145
Rate(bpm):
Cardiac Activity:   Observed
Presentation:       Transverse, head to maternal left
Placenta:           Posterior, above cervical os
P. Cord Insertion:  Visualized

Amniotic Fluid
AFI FV:      Subjectively within normal limits

Largest Pocket(cm)
4.0
Biometry

BPD:      42.4  mm     G. Age:  18w 6d         50  %    CI:        68.75   %    70 - 86
FL/HC:      17.3   %    16.1 -
HC:      163.4  mm     G. Age:  19w 1d         54  %    HC/AC:      1.14        1.09 -
AC:      143.6  mm     G. Age:  19w 5d         74  %    FL/BPD:     66.5   %
FL:       28.2  mm     G. Age:  18w 5d         36  %    FL/AC:      19.6   %    20 - 24

Est. FW:     278  gm    0 lb 10 oz      51  %
Gestational Age

LMP:           18w 6d        Date:  05/22/17                 EDD:   02/26/18
U/S Today:     19w 1d                                        EDD:   02/24/18
Best:          18w 6d     Det. By:  LMP  (05/22/17)          EDD:   02/26/18
Anatomy

Cranium:               Appears normal         Aortic Arch:            Appears normal
Cavum:                 Appears normal         Ductal Arch:            Appears normal
Ventricles:            Appears normal         Diaphragm:              Appears normal
Choroid Plexus:        Appears normal         Stomach:                Appears normal, left
sided
Cerebellum:            Appears normal         Abdomen:                Appears normal
Posterior Fossa:       Appears normal         Abdominal Wall:         Appears nml (cord
insert, abd wall)
Nuchal Fold:           Appears normal         Cord Vessels:           Appears normal (3
vessel cord)
Face:                  Orbits appear          Kidneys:                Appear normal
normal
Lips:                  Appears normal         Bladder:                Appears normal
Thoracic:              Appears normal         Spine:                  Appears normal
Heart:                 Appears normal         Upper Extremities:      Appears normal
(4CH, axis, and situs
RVOT:                  Appears normal         Lower Extremities:      Appears normal
LVOT:                  Appears normal

Other:  Male gender. Technically difficult due to fetal position.
Cervix Uterus Adnexa

Cervix
Length:            3.6  cm.
Normal appearance by transabdominal scan.

Adnexa:       No abnormality visualized.
Impression

Singleton intrauterine pregnancy at 18+6 weeks here for
anatomic survey
Review of the anatomy shows no sonographic markers for
aneuploidy or structural anomalies
All relevant anatomy has been visualized
Amniotic fluid volume is normal
Estimated fetal weight shows growth in the 51st percentile
Recommendations

Follow-up ultrasounds as clinically indicated.

## 2018-08-27 ENCOUNTER — Other Ambulatory Visit: Payer: Self-pay

## 2018-08-27 ENCOUNTER — Encounter: Payer: Self-pay | Admitting: Family Medicine

## 2018-08-27 ENCOUNTER — Ambulatory Visit (INDEPENDENT_AMBULATORY_CARE_PROVIDER_SITE_OTHER): Payer: Self-pay | Admitting: Family Medicine

## 2018-08-27 VITALS — BP 101/70 | HR 70

## 2018-08-27 DIAGNOSIS — N949 Unspecified condition associated with female genital organs and menstrual cycle: Secondary | ICD-10-CM

## 2018-08-27 DIAGNOSIS — N898 Other specified noninflammatory disorders of vagina: Secondary | ICD-10-CM

## 2018-08-27 LAB — POCT WET PREP (WET MOUNT)
Clue Cells Wet Prep Whiff POC: NEGATIVE
Trichomonas Wet Prep HPF POC: ABSENT
WBC, Wet Prep HPF POC: >20

## 2018-08-27 MED ORDER — FLUCONAZOLE 150 MG PO TABS: 150.0000 mg | ORAL_TABLET | Freq: Once | ORAL | 0 refills | Status: AC

## 2018-08-27 NOTE — Progress Notes (Signed)
   Subjective:   Patient ID: Kari Sandoval    DOB: 1990/08/16, 28 y.o. female   MRN: [id number]  CC: vaginal discharge   HPI: Kari Sandoval is a 28 y.o. female who presents to clinic today for the following issue.  Vaginal discharge Symptoms began 2 days ago with vaginal itching and irritation. Also with vaginal discharge which is white, thick and clumpy similar to yeast infections she has had in the past.  She has a significant other with whom she is sexually active and denies new partners.  She declines STD testing at this time.  She uses an IUD for contraception.  No abnormal bleeding, abdominal pain or cramping.    She denies any urinary symptoms. No fever, chills, nausea, vomiting or cough.    ROS: See HPI for pertinent ROS.  Social: pt is a never smoker.  Medications reviewed. Objective:   BP 101/70   Pulse 70   SpO2 100%  Vitals and nursing note reviewed.  General: well nourished, NAD Neck: supple CV: RRR no MRG  Lungs: CTAB, normal effort  Abdomen: soft, NTND, +bs  Pelvic exam: VULVA: normal appearing vulva with no masses, tenderness or lesions, VAGINA: normal appearing vagina with normal color, moderate white thick discharge present, no lesions, CERVIX: normal appearing cervix without discharge or lesions. Skin: warm, dry, no rash Extremities: warm and well perfused  Assessment & Plan:   Vaginal discharge Wet prep with moderate yeast and many bacteria present.   She declines STD testing as no known exposure and no new partners.  -Rx: diflucan, 2 tabs -Counseled on safe sex. -Follow up if symptoms worsen or do not improve   Orders Placed This Encounter  Procedures  . POCT Wet Prep [facility])   Freddrick March, MD [facility] PGY-3

## 2018-08-27 NOTE — Assessment & Plan Note (Addendum)
Wet prep with moderate yeast and many bacteria present.   She declines STD testing as no known exposure and no new partners.  -Rx: diflucan, 2 tabs -Counseled on safe sex. -Follow up if symptoms worsen or do not improve

## 2018-10-03 ENCOUNTER — Telehealth: Payer: Self-pay

## 2018-10-03 NOTE — Telephone Encounter (Signed)
Letter written, please let her know she can pick it up this afternoon I will place up front.

## 2018-10-03 NOTE — Telephone Encounter (Signed)
LVM asking her to return to my call. Letter up front for pick up.

## 2018-10-03 NOTE — Telephone Encounter (Signed)
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 12:30 and between 3:30-4pm.   Patient will come by the office to pick up letter when ready.

## 2019-06-02 ENCOUNTER — Other Ambulatory Visit: Payer: Self-pay | Admitting: Family Medicine

## 2019-06-02 ENCOUNTER — Ambulatory Visit
Admission: RE | Admit: 2019-06-02 | Discharge: 2019-06-02 | Disposition: A | Discharge status: Home / Self Care | Payer: Managed Care, Other (non HMO) | Source: Ambulatory Visit | Attending: Family Medicine | Admitting: Family Medicine

## 2019-06-02 DIAGNOSIS — Z01818 Encounter for other preprocedural examination: Secondary | ICD-10-CM

## 2019-09-30 ENCOUNTER — Ambulatory Visit: Payer: 59 | Attending: Family

## 2019-09-30 DIAGNOSIS — Z23 Encounter for immunization: Secondary | ICD-10-CM

## 2019-09-30 NOTE — Progress Notes (Signed)
   Covid-19 Vaccination Clinic  Name:  Kari Sandoval    MRN: [id number] DOB: [birth year]  09/30/2019  Kari Sandoval was observed post Covid-19 immunization for 15 minutes without incident. She was provided with Vaccine Information Sheet and instruction to access the V-Safe system.   Kari Sandoval was instructed to call 911 with any severe reactions post vaccine: Marland Kitchen Difficulty breathing  . Swelling of face and throat  . A fast heartbeat  . A bad rash all over body  . Dizziness and weakness   Immunizations Administered    Name Date Dose VIS Date Route   Pfizer COVID-19 Vaccine 09/30/2019  1:03 PM 0.3 mL 06/04/2018 Intramuscular   Manufacturer: [employer], [employer]   Lot: [id number]   NDC: [address]-5

## 2019-10-21 ENCOUNTER — Ambulatory Visit: Payer: 59 | Attending: Family

## 2019-10-21 DIAGNOSIS — Z23 Encounter for immunization: Secondary | ICD-10-CM

## 2019-10-21 NOTE — Progress Notes (Signed)
   Covid-19 Vaccination Clinic  Name:  Kari Sandoval    MRN: [id number] DOB: June 21, 1990  10/21/2019  [name] was observed post Covid-19 immunization for 15 minutes without incident. She was provided with Vaccine Information Sheet and instruction to access the V-Safe system.   [name] was instructed to call 911 with any severe reactions post vaccine: Marland Kitchen Difficulty breathing  . Swelling of face and throat  . A fast heartbeat  . A bad rash all over body  . Dizziness and weakness   Immunizations Administered    Name Date Dose VIS Date Route   Pfizer COVID-19 Vaccine 10/21/2019  1:29 PM 0.3 mL 06/04/2018 Intramuscular   Manufacturer: [employer], [employer]   Lot: [id number]   NDC: [address]-3

## 2019-10-27 ENCOUNTER — Ambulatory Visit: Payer: 59 | Attending: Internal Medicine

## 2019-10-27 DIAGNOSIS — Z20822 Contact with and (suspected) exposure to covid-19: Secondary | ICD-10-CM

## 2019-10-28 LAB — NOVEL CORONAVIRUS, NAA: SARS-CoV-2, NAA: NOT DETECTED

## 2019-10-28 LAB — SARS-COV-2, NAA 2 DAY TAT

## 2019-10-29 ENCOUNTER — Ambulatory Visit: Payer: 59 | Attending: Internal Medicine

## 2019-10-29 DIAGNOSIS — Z20822 Contact with and (suspected) exposure to covid-19: Secondary | ICD-10-CM

## 2019-10-30 LAB — NOVEL CORONAVIRUS, NAA: SARS-CoV-2, NAA: NOT DETECTED

## 2019-10-30 LAB — SARS-COV-2, NAA 2 DAY TAT

## 2020-04-19 IMAGING — CR DG CHEST 2V
2 series · 2 of 2 positions shown · non-contrast
Comparison: None.

CLINICAL DATA: Pre op history of asthma

EXAM:
CHEST - 2 VIEW

[w chest pa]
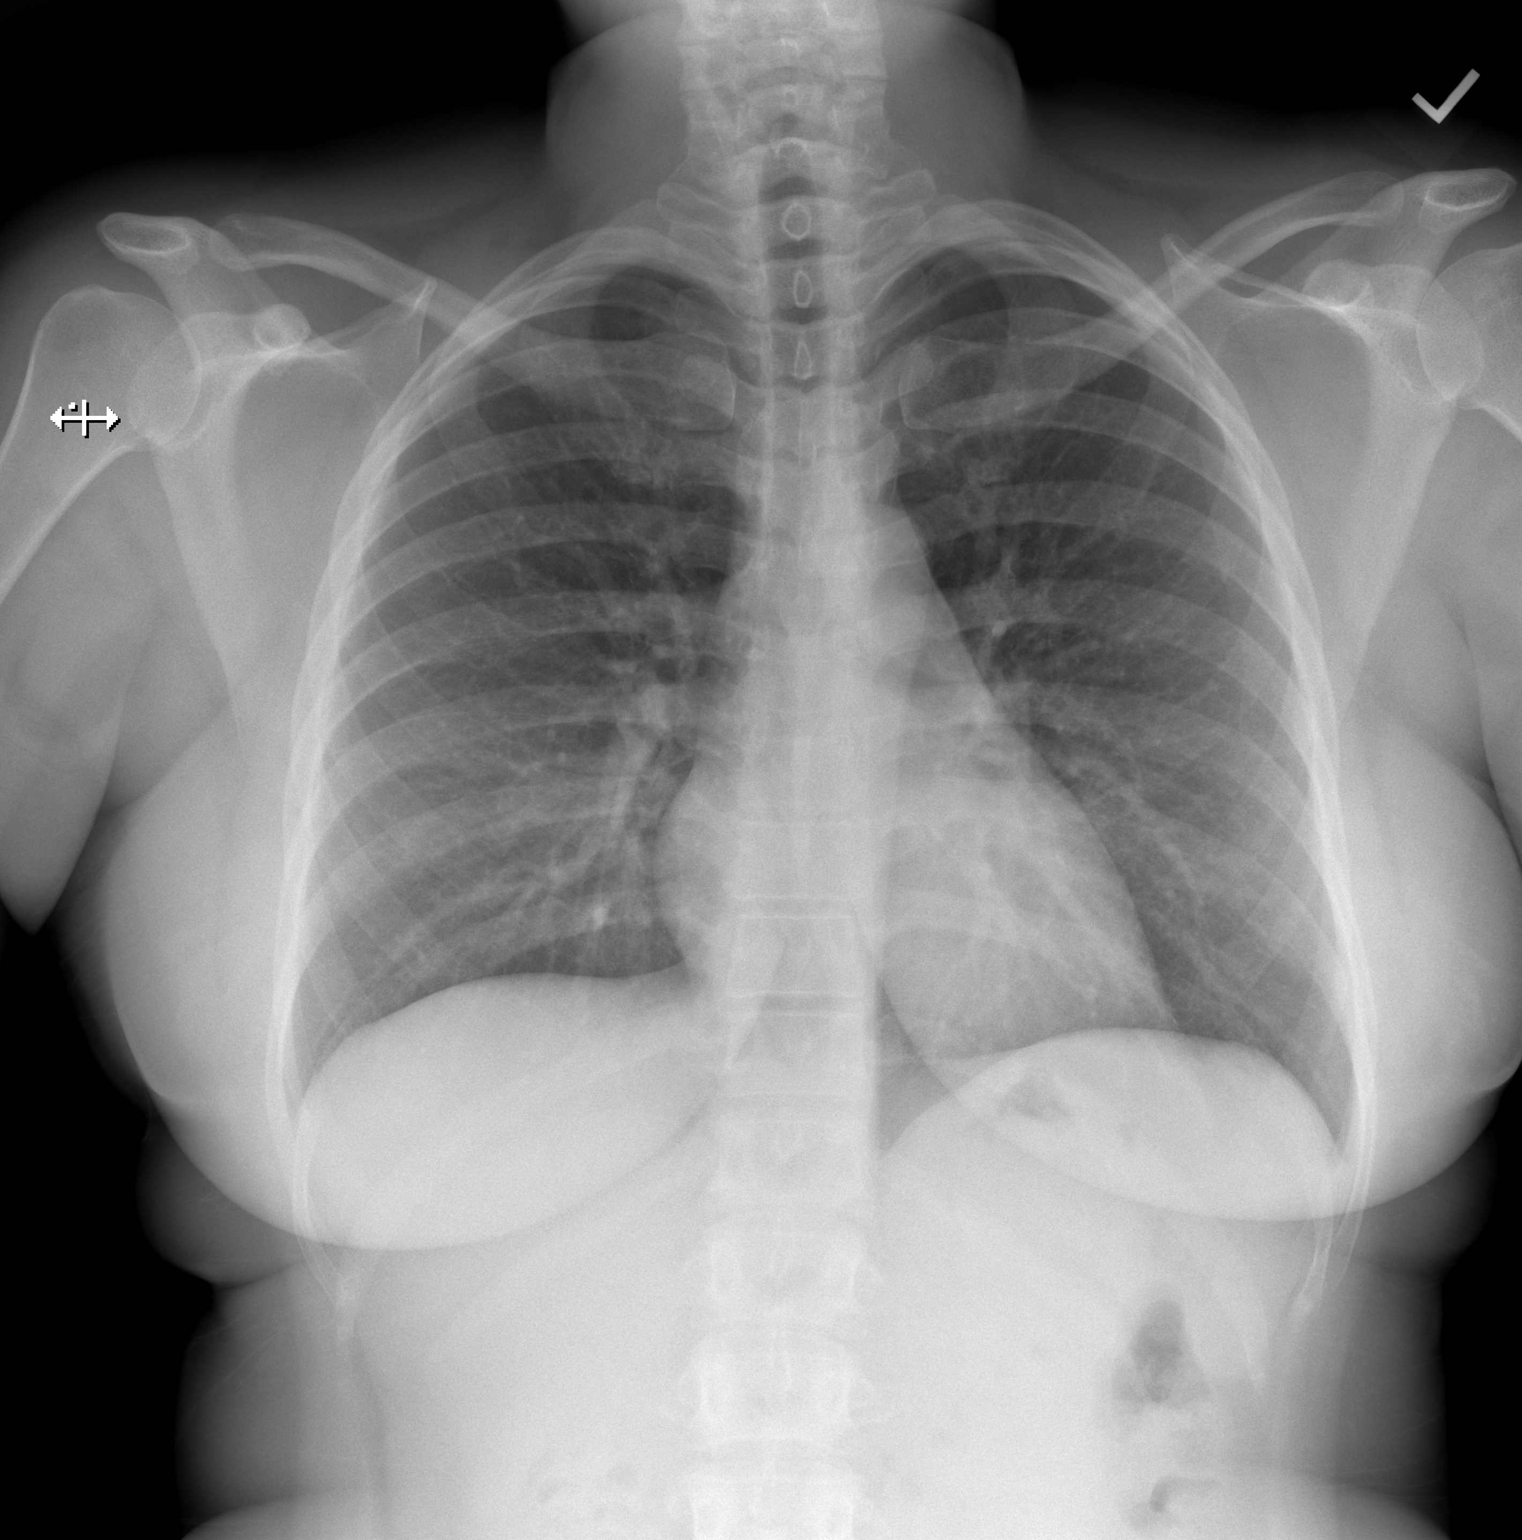

[w chest lat]
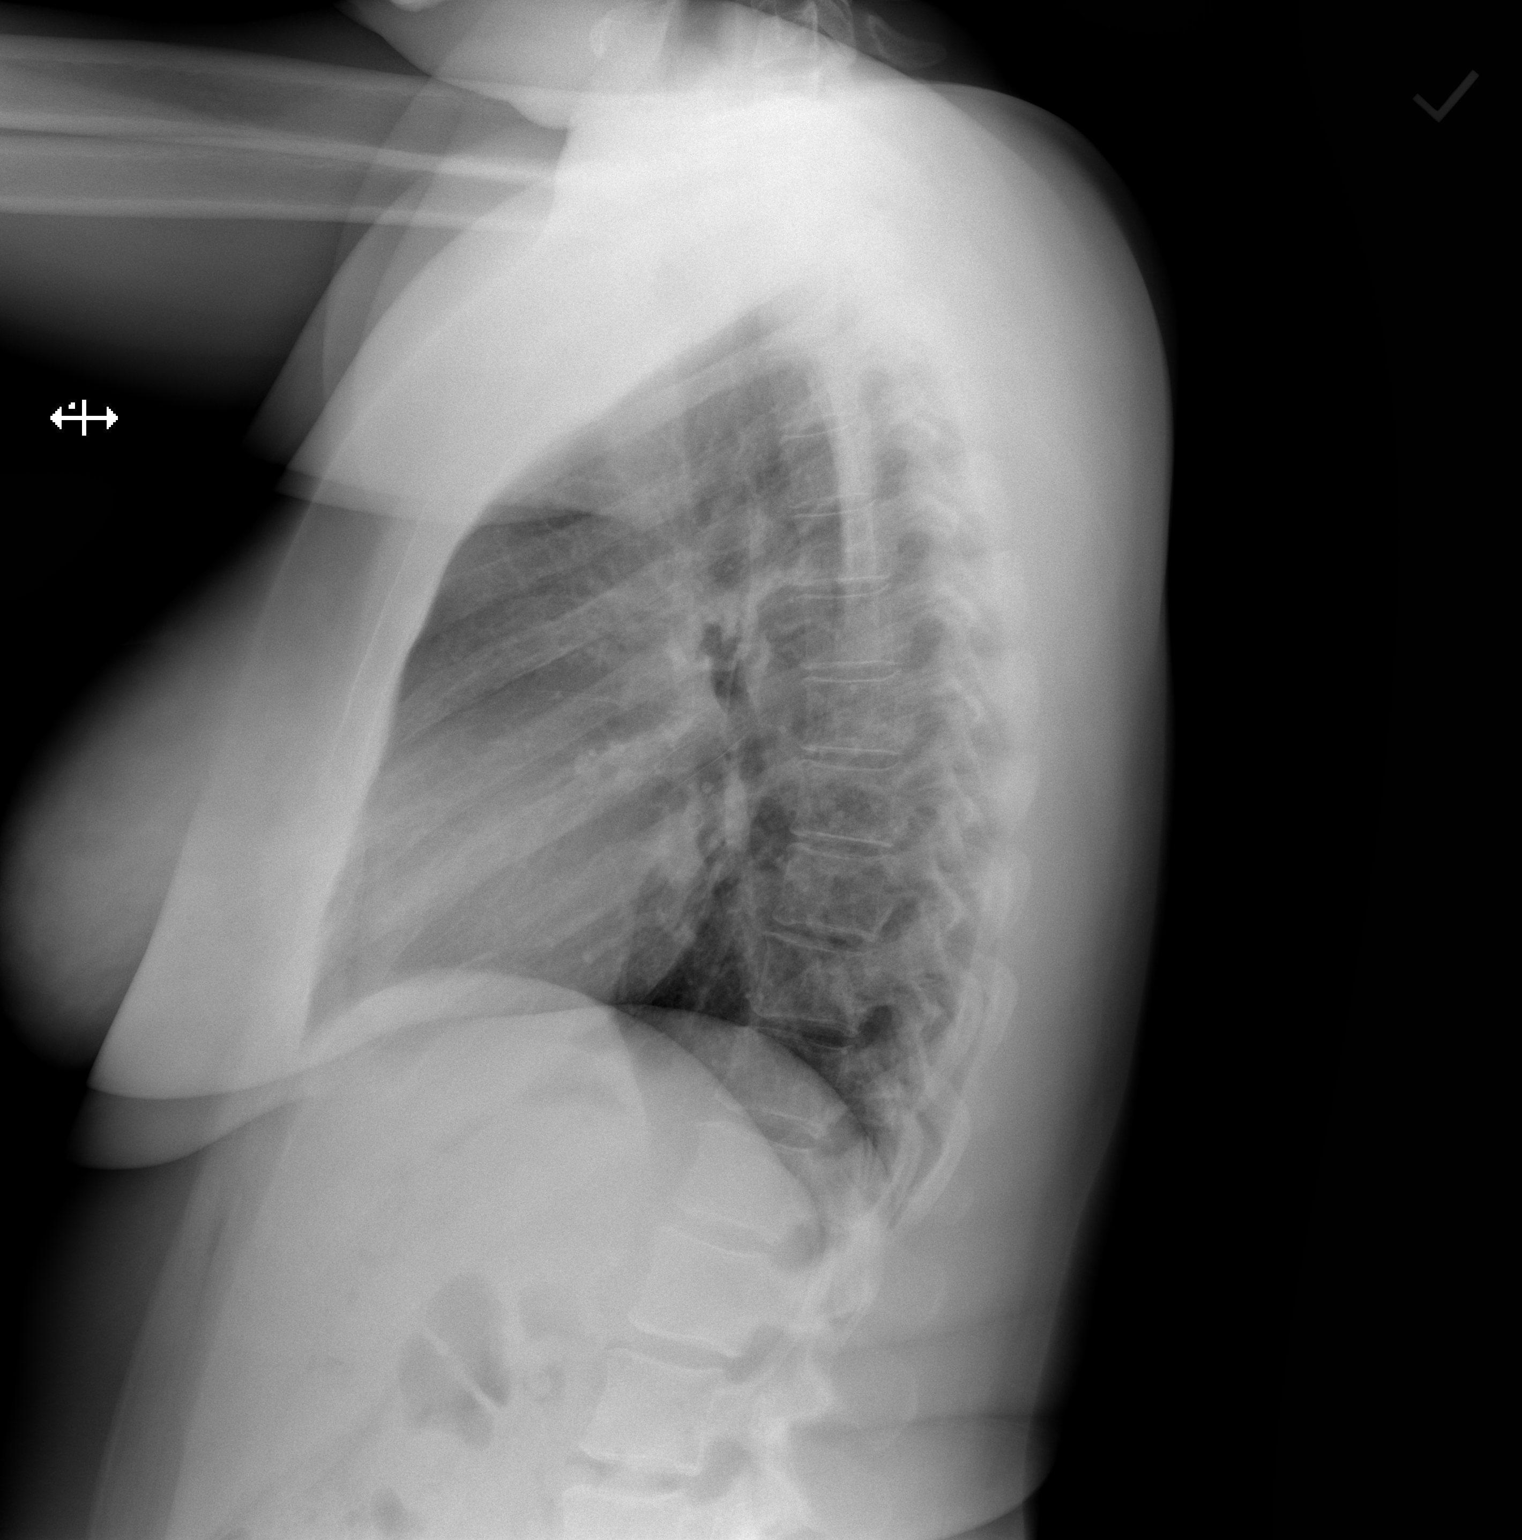

[2 of 2 positions shown; findings below may reference images not displayed]

FINDINGS: The heart size and mediastinal contours are within normal limits.
Both lungs are clear. The visualized skeletal structures are
unremarkable.
IMPRESSION: No active cardiopulmonary disease.

## 2022-06-22 ENCOUNTER — Other Ambulatory Visit: Payer: Self-pay | Admitting: Family Medicine

## 2022-06-22 ENCOUNTER — Other Ambulatory Visit (HOSPITAL_COMMUNITY)
Admission: RE | Admit: 2022-06-22 | Discharge: 2022-06-22 | Disposition: A | Discharge status: Home / Self Care | Payer: BC Managed Care – PPO | Source: Ambulatory Visit | Attending: Family Medicine | Admitting: Family Medicine

## 2022-06-22 DIAGNOSIS — Z01411 Encounter for gynecological examination (general) (routine) with abnormal findings: Secondary | ICD-10-CM | POA: Insufficient documentation

## 2022-06-27 LAB — CYTOLOGY - PAP

## 2022-06-30 LAB — CYTOLOGY - PAP: Comment: NEGATIVE

## 2023-10-22 ENCOUNTER — Ambulatory Visit (INDEPENDENT_AMBULATORY_CARE_PROVIDER_SITE_OTHER): Admitting: Physician Assistant

## 2023-10-22 DIAGNOSIS — Z91199 Patient's noncompliance with other medical treatment and regimen due to unspecified reason: Secondary | ICD-10-CM

## 2023-10-22 NOTE — Progress Notes (Signed)
 Patient no-showed today's appointment.
